# Patient Record
Sex: Male | Born: 1935 | Race: White | Hispanic: No | Smoking: Former smoker
Health system: Southern US, Community
[De-identification: ages and names within clinical notes are randomized; demographics above are authoritative.]

## PROBLEM LIST (undated history)

## (undated) DIAGNOSIS — I2510 Atherosclerotic heart disease of native coronary artery without angina pectoris: Secondary | ICD-10-CM

## (undated) DIAGNOSIS — I219 Acute myocardial infarction, unspecified: Secondary | ICD-10-CM

## (undated) DIAGNOSIS — K219 Gastro-esophageal reflux disease without esophagitis: Secondary | ICD-10-CM

## (undated) DIAGNOSIS — I82451 Acute embolism and thrombosis of right peroneal vein: Secondary | ICD-10-CM

## (undated) DIAGNOSIS — K4090 Unilateral inguinal hernia, without obstruction or gangrene, not specified as recurrent: Secondary | ICD-10-CM

## (undated) DIAGNOSIS — R7303 Prediabetes: Secondary | ICD-10-CM

## (undated) DIAGNOSIS — I480 Paroxysmal atrial fibrillation: Secondary | ICD-10-CM

## (undated) DIAGNOSIS — R0609 Other forms of dyspnea: Secondary | ICD-10-CM

## (undated) DIAGNOSIS — I10 Essential (primary) hypertension: Secondary | ICD-10-CM

## (undated) DIAGNOSIS — N400 Enlarged prostate without lower urinary tract symptoms: Secondary | ICD-10-CM

## (undated) DIAGNOSIS — R0600 Dyspnea, unspecified: Secondary | ICD-10-CM

## (undated) DIAGNOSIS — G2581 Restless legs syndrome: Secondary | ICD-10-CM

## (undated) DIAGNOSIS — G4733 Obstructive sleep apnea (adult) (pediatric): Secondary | ICD-10-CM

## (undated) DIAGNOSIS — E039 Hypothyroidism, unspecified: Secondary | ICD-10-CM

## (undated) DIAGNOSIS — I4510 Unspecified right bundle-branch block: Secondary | ICD-10-CM

## (undated) DIAGNOSIS — R600 Localized edema: Secondary | ICD-10-CM

## (undated) DIAGNOSIS — E785 Hyperlipidemia, unspecified: Secondary | ICD-10-CM

## (undated) DIAGNOSIS — C9110 Chronic lymphocytic leukemia of B-cell type not having achieved remission: Secondary | ICD-10-CM

## (undated) DIAGNOSIS — Z951 Presence of aortocoronary bypass graft: Secondary | ICD-10-CM

## (undated) DIAGNOSIS — G20A1 Parkinson's disease without dyskinesia, without mention of fluctuations: Secondary | ICD-10-CM

## (undated) DIAGNOSIS — I6789 Other cerebrovascular disease: Secondary | ICD-10-CM

## (undated) DIAGNOSIS — M4306 Spondylolysis, lumbar region: Secondary | ICD-10-CM

## (undated) DIAGNOSIS — M1990 Unspecified osteoarthritis, unspecified site: Secondary | ICD-10-CM

## (undated) DIAGNOSIS — R4189 Other symptoms and signs involving cognitive functions and awareness: Secondary | ICD-10-CM

## (undated) DIAGNOSIS — I4891 Unspecified atrial fibrillation: Secondary | ICD-10-CM

## (undated) DIAGNOSIS — R29898 Other symptoms and signs involving the musculoskeletal system: Secondary | ICD-10-CM

## (undated) DIAGNOSIS — E119 Type 2 diabetes mellitus without complications: Secondary | ICD-10-CM

## (undated) DIAGNOSIS — M48061 Spinal stenosis, lumbar region without neurogenic claudication: Secondary | ICD-10-CM

## (undated) DIAGNOSIS — I700 Atherosclerosis of aorta: Secondary | ICD-10-CM

## (undated) DIAGNOSIS — I5030 Unspecified diastolic (congestive) heart failure: Secondary | ICD-10-CM

## (undated) DIAGNOSIS — Z7901 Long term (current) use of anticoagulants: Secondary | ICD-10-CM

## (undated) HISTORY — DX: Acute myocardial infarction, unspecified: I21.9

## (undated) HISTORY — PX: CORONARY ARTERY BYPASS GRAFT: SHX141

## (undated) HISTORY — PX: TONSILLECTOMY: SUR1361

## (undated) HISTORY — DX: Atherosclerotic heart disease of native coronary artery without angina pectoris: I25.10

## (undated) HISTORY — PX: COLONOSCOPY W/ POLYPECTOMY: SHX1380

## (undated) HISTORY — PX: HERNIA REPAIR: SHX51

## (undated) HISTORY — PX: OTHER SURGICAL HISTORY: SHX169

---

## 1986-03-14 DIAGNOSIS — I219 Acute myocardial infarction, unspecified: Secondary | ICD-10-CM

## 1986-03-14 DIAGNOSIS — Z951 Presence of aortocoronary bypass graft: Secondary | ICD-10-CM

## 1986-03-14 HISTORY — DX: Acute myocardial infarction, unspecified: I21.9

## 1986-03-14 HISTORY — PX: OTHER SURGICAL HISTORY: SHX169

## 1986-03-14 HISTORY — DX: Presence of aortocoronary bypass graft: Z95.1

## 1986-10-13 DIAGNOSIS — I219 Acute myocardial infarction, unspecified: Secondary | ICD-10-CM

## 1986-10-13 HISTORY — PX: LEFT HEART CATH AND CORONARY ANGIOGRAPHY: CATH118249

## 1986-10-13 HISTORY — DX: Acute myocardial infarction, unspecified: I21.9

## 1987-02-12 HISTORY — PX: CORONARY ARTERY BYPASS GRAFT: SHX141

## 2005-03-14 HISTORY — PX: OTHER SURGICAL HISTORY: SHX169

## 2005-03-14 HISTORY — PX: CORONARY STENT PLACEMENT: SHX1402

## 2015-12-30 DIAGNOSIS — I10 Essential (primary) hypertension: Secondary | ICD-10-CM | POA: Insufficient documentation

## 2015-12-30 DIAGNOSIS — E7800 Pure hypercholesterolemia, unspecified: Secondary | ICD-10-CM | POA: Insufficient documentation

## 2015-12-30 DIAGNOSIS — I2510 Atherosclerotic heart disease of native coronary artery without angina pectoris: Secondary | ICD-10-CM | POA: Insufficient documentation

## 2015-12-30 DIAGNOSIS — E039 Hypothyroidism, unspecified: Secondary | ICD-10-CM | POA: Insufficient documentation

## 2015-12-30 DIAGNOSIS — Z9861 Coronary angioplasty status: Secondary | ICD-10-CM | POA: Insufficient documentation

## 2016-01-13 ENCOUNTER — Inpatient Hospital Stay: Payer: Medicare HMO | Attending: Internal Medicine | Admitting: Internal Medicine

## 2016-01-13 ENCOUNTER — Encounter (INDEPENDENT_AMBULATORY_CARE_PROVIDER_SITE_OTHER): Payer: Self-pay

## 2016-01-13 ENCOUNTER — Inpatient Hospital Stay: Payer: Medicare HMO

## 2016-01-13 DIAGNOSIS — Z87891 Personal history of nicotine dependence: Secondary | ICD-10-CM | POA: Diagnosis not present

## 2016-01-13 DIAGNOSIS — D72829 Elevated white blood cell count, unspecified: Secondary | ICD-10-CM | POA: Diagnosis not present

## 2016-01-13 DIAGNOSIS — D72828 Other elevated white blood cell count: Secondary | ICD-10-CM | POA: Diagnosis not present

## 2016-01-13 DIAGNOSIS — Z7982 Long term (current) use of aspirin: Secondary | ICD-10-CM | POA: Insufficient documentation

## 2016-01-13 DIAGNOSIS — I252 Old myocardial infarction: Secondary | ICD-10-CM | POA: Insufficient documentation

## 2016-01-13 DIAGNOSIS — Z7902 Long term (current) use of antithrombotics/antiplatelets: Secondary | ICD-10-CM | POA: Insufficient documentation

## 2016-01-13 DIAGNOSIS — I2510 Atherosclerotic heart disease of native coronary artery without angina pectoris: Secondary | ICD-10-CM | POA: Diagnosis not present

## 2016-01-13 DIAGNOSIS — Z856 Personal history of leukemia: Secondary | ICD-10-CM | POA: Insufficient documentation

## 2016-01-13 DIAGNOSIS — Z955 Presence of coronary angioplasty implant and graft: Secondary | ICD-10-CM | POA: Insufficient documentation

## 2016-01-13 LAB — CBC WITH DIFFERENTIAL/PLATELET
BASOS ABS: 0.1 10*3/uL (ref 0–0.1)
BASOS PCT: 0 %
Eosinophils Absolute: 0.3 10*3/uL (ref 0–0.7)
Eosinophils Relative: 2 %
HEMATOCRIT: 42.8 % (ref 40.0–52.0)
HEMOGLOBIN: 14 g/dL (ref 13.0–18.0)
LYMPHS PCT: 75 %
Lymphs Abs: 13.8 10*3/uL — ABNORMAL HIGH (ref 1.0–3.6)
MCH: 28.6 pg (ref 26.0–34.0)
MCHC: 32.8 g/dL (ref 32.0–36.0)
MCV: 87.1 fL (ref 80.0–100.0)
MONOS PCT: 4 %
Monocytes Absolute: 0.7 10*3/uL (ref 0.2–1.0)
NEUTROS ABS: 3.5 10*3/uL (ref 1.4–6.5)
NEUTROS PCT: 19 %
Platelets: 154 10*3/uL (ref 150–440)
RBC: 4.91 MIL/uL (ref 4.40–5.90)
RDW: 14.1 % (ref 11.5–14.5)
WBC: 18.4 10*3/uL — ABNORMAL HIGH (ref 3.8–10.6)

## 2016-01-13 LAB — PATHOLOGIST SMEAR REVIEW

## 2016-01-13 NOTE — Progress Notes (Signed)
Patient's labs have been followed for the past 8 years due to leukocytosis and PCP would like for him to have hematology evaluation.

## 2016-01-13 NOTE — Progress Notes (Signed)
[place]  [phone] [phone]  ID: Connor Mays OB: 1930-08-24  MR#: [id number]  [id number]  Patient Care Team: Glendon Axe, MD as PCP - General (Internal Medicine)  CHIEF COMPLAINT: New Evaluation (leukocytosis) CLL   HPI: 82 year old gentleman who reportedly has a known history of chronic lymphocytic leukemia diagnosed about 6-8 years ago and had been in follow-up with hematologist Dr. Casey Burkitt out of [place] He did not require any treatment but he had been under observation.  All history today's as reported by the patient I do not have any records to substantiate this.  His performance status is 0 and his comorbid conditions include coronary artery disease with a bypass surgery several years ago and more recently a stent placed He is on Plavix and is stable.    REVIEW OF SYSTEMS:   Review of Systems  Constitutional: Negative.   HENT: Negative.   Eyes: Negative.   Respiratory: Negative.   Cardiovascular: Negative.   Gastrointestinal: Negative.   Genitourinary: Negative.   Musculoskeletal: Negative.   Skin: Negative.   Neurological: Negative.   Endo/Heme/Allergies: Negative.   Psychiatric/Behavioral: Negative.   All other systems reviewed and are negative.   As per HPI. Otherwise, a complete review of systems is negative.  PAST MEDICAL HISTORY: Past Medical History:  Diagnosis Date  . CAD (coronary artery disease)   . Heart attack     PAST SURGICAL HISTORY: Past Surgical History:  Procedure Laterality Date  . CORONARY STENT PLACEMENT  2007  . heart bypass  1988  . TONSILLECTOMY      FAMILY HISTORY: No family history on file.  ADVANCED DIRECTIVES (Y/N):  N  HEALTH MAINTENANCE: Social History  Substance Use Topics  . Smoking status: Former Smoker    Types: Cigars  . Smokeless tobacco: Never Used  . Alcohol use Not on file     Colonoscopy:  PAP:  Bone density:  Lipid  panel:  Allergies  Allergen Reactions  . Morphine Other (See Comments)    Pre-syncope    Current Outpatient Prescriptions  Medication Sig Dispense Refill  . Ascorbic Acid (VITAMIN C) 1000 MG tablet Take by mouth.    Marland Kitchen aspirin (GOODSENSE ASPIRIN) 325 MG tablet Take by mouth.    . Cholecalciferol (VITAMIN D3) 1000 units CAPS Take by mouth.    . clopidogrel (PLAVIX) 75 MG tablet Take by mouth.    . folic acid (FOLVITE) A999333 MCG tablet Take by mouth.    . levothyroxine (SYNTHROID, LEVOTHROID) 50 MCG tablet Take by mouth.    . losartan (COZAAR) 100 MG tablet Take by mouth.    . metoprolol (LOPRESSOR) 50 MG tablet TK 1 T PO QD    . Multiple Vitamin (MULTIVITAMIN) capsule Take 1 capsule by mouth daily.    . simvastatin (ZOCOR) 40 MG tablet Take 40 mg by mouth daily.     No current facility-administered medications for this visit.     OBJECTIVE: Vitals:   01/13/16 1434  BP: (!) 150/84  Pulse: 76  Resp: 18  Temp: (!) 96.2 F (35.7 C)     Body mass index is 33.27 kg/m.   2.3 meters squared  ECOG FS:0 - Asymptomatic  Physical Exam  Constitutional: He is oriented to person, place, and time. No distress.  obese  HENT:  Head: Normocephalic and atraumatic.  Eyes: EOM are normal. Pupils are equal, round, and reactive to light.  Neck: Normal range of motion. Neck supple.  Cardiovascular: Normal rate  and normal heart sounds.   B/l leg edema symmetric upto the ankles  Pulmonary/Chest: Effort normal and breath sounds normal.  Abdominal: Soft. Bowel sounds are normal. He exhibits no distension and no mass.  Musculoskeletal: Normal range of motion. He exhibits edema. He exhibits no tenderness or deformity.  Lymphadenopathy:    He has no cervical adenopathy.  Neurological: He is alert and oriented to person, place, and time. No cranial nerve deficit.  Skin: Skin is warm. No rash noted. No erythema. No pallor.  Psychiatric: He has a normal mood and affect. His behavior is normal. Judgment  and thought content normal.  Nursing note and vitals reviewed.  LAB RESULTS:   His CBC most recently drawn at [place] by his family Physician shows a white blood cell count of 18    ASSESSMENT:  CLL by history. Likely stage 0.- Needing confirmation   He is currently asymptomatic with no B symptoms. No palpable splenomegaly no palpable lymphadenopathy.  however there is no differential count in order to determine whether this is predominantly lymphocytosis as expected with CLL.   Plan  call for records from [facility] hematologist Dr. Casey Burkitt substantiate a diagnosis of CLL.  We'll draw a CBC and a peripheral blood flow cytometry.  After reviewing records if everything appears to be stable he can return for 1 year follow-up as he had been doing in [place] for the last 6-8 years.   Patient expressed understanding and was in agreement with this plan. He also understands that He can call clinic at any time with any questions, concerns, or complaints.  Orders Placed This Encounter  Procedures  . CBC with Differential    Standing Status:   Future    Standing Expiration Date:   01/12/2017    No Follow-up on file. No matching staging information was found for the patient.  Creola Corn, MD   01/13/2016 2:50 PM

## 2016-01-15 LAB — COMP PANEL: LEUKEMIA/LYMPHOMA

## 2016-04-13 DIAGNOSIS — R7303 Prediabetes: Secondary | ICD-10-CM | POA: Insufficient documentation

## 2016-10-07 ENCOUNTER — Encounter: Payer: Self-pay | Admitting: Hematology and Oncology

## 2016-10-16 DIAGNOSIS — N529 Male erectile dysfunction, unspecified: Secondary | ICD-10-CM | POA: Insufficient documentation

## 2016-10-16 DIAGNOSIS — M545 Low back pain, unspecified: Secondary | ICD-10-CM | POA: Insufficient documentation

## 2016-10-16 DIAGNOSIS — G8929 Other chronic pain: Secondary | ICD-10-CM | POA: Insufficient documentation

## 2016-12-14 DIAGNOSIS — G4733 Obstructive sleep apnea (adult) (pediatric): Secondary | ICD-10-CM | POA: Insufficient documentation

## 2016-12-14 DIAGNOSIS — G2581 Restless legs syndrome: Secondary | ICD-10-CM | POA: Insufficient documentation

## 2016-12-14 DIAGNOSIS — Z9989 Dependence on other enabling machines and devices: Secondary | ICD-10-CM | POA: Insufficient documentation

## 2017-01-12 ENCOUNTER — Other Ambulatory Visit: Payer: Self-pay | Admitting: *Deleted

## 2017-01-12 DIAGNOSIS — D72829 Elevated white blood cell count, unspecified: Secondary | ICD-10-CM

## 2017-01-13 ENCOUNTER — Encounter: Payer: Self-pay | Admitting: Hematology and Oncology

## 2017-01-13 ENCOUNTER — Inpatient Hospital Stay: Payer: Medicare HMO | Attending: Hematology and Oncology

## 2017-01-13 ENCOUNTER — Inpatient Hospital Stay (HOSPITAL_BASED_OUTPATIENT_CLINIC_OR_DEPARTMENT_OTHER): Payer: Medicare HMO | Admitting: Hematology and Oncology

## 2017-01-13 ENCOUNTER — Other Ambulatory Visit: Payer: Self-pay | Admitting: *Deleted

## 2017-01-13 VITALS — BP 127/73 | HR 60 | Temp 96.8°F | Resp 18 | Ht 70.0 in | Wt 235.2 lb

## 2017-01-13 DIAGNOSIS — M7989 Other specified soft tissue disorders: Secondary | ICD-10-CM | POA: Insufficient documentation

## 2017-01-13 DIAGNOSIS — C9110 Chronic lymphocytic leukemia of B-cell type not having achieved remission: Secondary | ICD-10-CM | POA: Diagnosis not present

## 2017-01-13 DIAGNOSIS — Z95818 Presence of other cardiac implants and grafts: Secondary | ICD-10-CM | POA: Insufficient documentation

## 2017-01-13 DIAGNOSIS — Z87891 Personal history of nicotine dependence: Secondary | ICD-10-CM

## 2017-01-13 DIAGNOSIS — I252 Old myocardial infarction: Secondary | ICD-10-CM | POA: Diagnosis not present

## 2017-01-13 DIAGNOSIS — Z79899 Other long term (current) drug therapy: Secondary | ICD-10-CM

## 2017-01-13 DIAGNOSIS — I2510 Atherosclerotic heart disease of native coronary artery without angina pectoris: Secondary | ICD-10-CM

## 2017-01-13 DIAGNOSIS — Z7982 Long term (current) use of aspirin: Secondary | ICD-10-CM

## 2017-01-13 DIAGNOSIS — D72829 Elevated white blood cell count, unspecified: Secondary | ICD-10-CM

## 2017-01-13 LAB — CBC WITH DIFFERENTIAL/PLATELET
Basophils Absolute: 0 10*3/uL (ref 0–0.1)
Basophils Relative: 0 %
Eosinophils Absolute: 0.2 10*3/uL (ref 0–0.7)
Eosinophils Relative: 1 %
HCT: 43.6 % (ref 40.0–52.0)
Hemoglobin: 14.4 g/dL (ref 13.0–18.0)
Lymphocytes Relative: 69 %
Lymphs Abs: 11.6 10*3/uL — ABNORMAL HIGH (ref 1.0–3.6)
MCH: 29.3 pg (ref 26.0–34.0)
MCHC: 33 g/dL (ref 32.0–36.0)
MCV: 89 fL (ref 80.0–100.0)
Monocytes Absolute: 0.6 10*3/uL (ref 0.2–1.0)
Monocytes Relative: 3 %
Neutro Abs: 4.6 10*3/uL (ref 1.4–6.5)
Neutrophils Relative %: 27 %
Platelets: 151 10*3/uL (ref 150–440)
RBC: 4.9 MIL/uL (ref 4.40–5.90)
RDW: 14 % (ref 11.5–14.5)
WBC: 17 10*3/uL — ABNORMAL HIGH (ref 3.8–10.6)

## 2017-01-13 NOTE — Progress Notes (Signed)
[facility] day:  01/13/2017  Chief Complaint: Connor Mays is a 83 y.o. male  with chronic lymphocytic leukemia (CLL) who is seen for reassessment.  HPI: The patient was diagnosed with CLL approximately 9 years ago. He was followed by Dr. Adriana Simas in [place]. He has not required any treatment and has been under observation. Patient moved to [facility] in September of 2017. Patient has never had bone marrow biopsy. Highest WBC that the patient can recall was 22,000.   The patient was last seen in the medical oncology clinic on 01/13/2016 by Creola Corn.  At that time, he denied any symptoms. Exam revealed no adenopathy.    CBC on 01/13/2016 revealed a hematocrit of 42.8, hemoglobin 14.0, MCV 87.1, platelets 154,000, white count 18,400 with an ANC of 3500.  Absolute lymphocyte count was 13,800.  Peripheral smear revealed significant lymphocytosis with a monomorphic population of small lymphocytes consistent with CLL.  Flow cytometry on 01/12/2017 revealed a CD5 positive, CD23 positive clonal B-cell population with dim lambda light chain restriction, representing greater than 99% of the B cells and 70% of the leukocytes. Phenotype was typical of CLL/SLL. CD38 was expressed on a proximally 76% of clonal B cells.   There were no circulating blasts.  Symptimatically, patient feels well. He denies B symptoms and infections. There is no pruritis associated with bathing. Patient denies bruising or bleeding; no hematochezia or melena. Last colonoscopy was 10 years ago. He denies familial history of cancers. He has prostate checked on a regular basis. Patient has intermittent ankle swelling.    Past Medical History:  Diagnosis Date  . CAD (coronary artery disease)   . Heart attack [facility])     Past Surgical History:  Procedure Laterality Date  . CORONARY STENT PLACEMENT  2007  . heart bypass  1988  . TONSILLECTOMY      Family History   Problem Relation Age of Onset  . Healthy Sister   . Bladder Cancer Brother   . Healthy Sister     Social History:  reports that he has quit smoking. His smoking use included Cigars. He has never used smokeless tobacco. He reports that he does not use drugs. His alcohol history is not on file.  Patient is a former pipe and cigar smoker; quit 50 years ago. Patient infrequently drinks beer. Patient denies known exposure to radiation and toxins. Patient is a retired Conservation officer, nature; worked for [employer]. The patient is alone today.  Allergies:  Allergies  Allergen Reactions  . Morphine Other (See Comments)    Pre-syncope    Current Medications: Current Outpatient Prescriptions  Medication Sig Dispense Refill  . Ascorbic Acid (VITAMIN C) 1000 MG tablet Take by mouth.    Marland Kitchen aspirin EC 81 MG tablet Take 81 mg by mouth daily.    . Cholecalciferol (VITAMIN D3) 1000 units CAPS Take by mouth.    . clopidogrel (PLAVIX) 75 MG tablet Take by mouth.    . folic acid (FOLVITE) 157 MCG tablet Take by mouth.    . levothyroxine (SYNTHROID, LEVOTHROID) 50 MCG tablet Take by mouth.    . losartan (COZAAR) 100 MG tablet Take by mouth.    . metoprolol (LOPRESSOR) 50 MG tablet TK 1 T PO QD    . Multiple Vitamin (MULTIVITAMIN) capsule Take 1 capsule by mouth daily.    . simvastatin (ZOCOR) 40 MG tablet Take 40 mg by mouth daily.     No current  facility-administered medications for this visit.     Review of Systems:  GENERAL:  Feels well.  No fevers, sweats or weight loss. PERFORMANCE STATUS (ECOG):  1 HEENT:  No visual changes, runny nose, sore throat, mouth sores or tenderness. Lungs: No shortness of breath or cough.  No hemoptysis. Cardiac:  No chest pain, palpitations, orthopnea, or PND. GI:  "loves to eat".  No early satiety.  No nausea, vomiting, diarrhea, constipation, melena or hematochezia. Last colonoscopy 10 years ago. GU:  No urgency, frequency, dysuria, or hematuria.  PSA "no  problems". Musculoskeletal:  No back pain.  No joint pain.  No muscle tenderness. Extremities:  Intermittent ankle swelling. No pain. Skin:  No rashes or skin changes. Neuro:  No headache, numbness or weakness, balance or coordination issues. Endocrine:  No diabetes.  Thyroid disease on Synthroid.  No hot flashes or night sweats. Psych:  No mood changes, depression or anxiety. Pain:  No focal pain. Review of systems:  All other systems reviewed and found to be negative.  Physical Exam: Blood pressure 127/73, pulse 60, temperature (!) 96.8 F (36 C), temperature source Tympanic, resp. rate 18, height _0  (1.778 m), weight 235 lb 3.2 oz (106.7 kg). GENERAL:  Well developed, well nourished, gentleman sitting comfortably in the exam room in no acute distress.  He appears younger than his stated age. MENTAL STATUS:  Alert and oriented to person, place and time. HEAD:  Pearline Cables hair.  Normocephalic, atraumatic, face symmetric, no Cushingoid features. EYES:  Glasses.  Blue eyes.  Pupils equal round and reactive to light and accomodation.  No conjunctivitis or scleral icterus. ENT:  Oropharynx clear without lesion.  Tongue normal. Mucous membranes moist.  RESPIRATORY:  Clear to auscultation without rales, wheezes or rhonchi. CARDIOVASCULAR:  Regular rate and rhythm without murmur, rub or gallop. ABDOMEN:  Soft, non-tender, with active bowel sounds, and no hepatosplenomegaly.  No masses. SKIN:  No rashes, ulcers or lesions. EXTREMITIES: No edema, no skin discoloration or tenderness.  No palpable cords. LYMPH NODES: No palpable cervical, supraclavicular, axillary or inguinal adenopathy  NEUROLOGICAL: Unremarkable. PSYCH:  Appropriate.   Appointment on 01/13/2017  Component Date Value Ref Range Status  . WBC 01/13/2017 17.0* 3.8 - 10.6 K/uL Final  . RBC 01/13/2017 4.90  4.40 - 5.90 MIL/uL Final  . Hemoglobin 01/13/2017 14.4  13.0 - 18.0 g/dL Final  . HCT 01/13/2017 43.6  40.0 - 52.0 % Final  .  MCV 01/13/2017 89.0  80.0 - 100.0 fL Final  . MCH 01/13/2017 29.3  26.0 - 34.0 pg Final  . MCHC 01/13/2017 33.0  32.0 - 36.0 g/dL Final  . RDW 01/13/2017 14.0  11.5 - 14.5 % Final  . Platelets 01/13/2017 151  150 - 440 K/uL Final  . Neutrophils Relative % 01/13/2017 27  % Final  . Neutro Abs 01/13/2017 4.6  1.4 - 6.5 K/uL Final  . Lymphocytes Relative 01/13/2017 69  % Final  . Lymphs Abs 01/13/2017 11.6* 1.0 - 3.6 K/uL Final  . Monocytes Relative 01/13/2017 3  % Final  . Monocytes Absolute 01/13/2017 0.6  0.2 - 1.0 K/uL Final  . Eosinophils Relative 01/13/2017 1  % Final  . Eosinophils Absolute 01/13/2017 0.2  0 - 0.7 K/uL Final  . Basophils Relative 01/13/2017 0  % Final  . Basophils Absolute 01/13/2017 0.0  0 - 0.1 K/uL Final    Assessment:  Connor Mays is a 83 y.o. male with stage 0 CLL.  He was diagnosed in 2010-2012  while living in [place], [place].  WBC has ranged between 17,000 - 22,000.  Flow cytometry on 01/12/2017 revealed a CD5 positive, CD23 positive clonal B-cell population with dim lambda light chain restriction, representing greater than 99% of the B cells and 70% of the leukocytes. Phenotype was typical of CLL/SLL. CD38 was expressed on a proximally 76% of clonal B cells.   There were no circulating blasts.  Symptimatically, he feels well.  He denies any B symptoms, bruising or bleeding, or issues with infections. Exam reveals no adenopathy or hepatosplenomegaly.  Plan: 1.  Labs today: CBC with diff 2.  Discuss medical history and stage 0 CLL. WBC stable at 17,000. Discuss annual monitoring in the hematology clinic.  Patient to contact clinic if any interval concerns. 3.  RTC in 1 year for MD assessment and labs (CBC with diff).   Honor Loh, NP  01/13/2017, 10:53 AM   I saw and evaluated the patient, participating in the key portions of the service and reviewing pertinent diagnostic studies and records.  I reviewed the nurse practitioner's note and agree with the  findings and the plan.  The assessment and plan were discussed with the patient.  A few questions were asked by the patient and answered.   Nolon Stalls, MD 01/13/2017, 10:53 AM

## 2017-01-14 ENCOUNTER — Encounter: Payer: Self-pay | Admitting: Hematology and Oncology

## 2017-05-02 ENCOUNTER — Ambulatory Visit
Admission: RE | Admit: 2017-05-02 | Discharge: 2017-05-02 | Disposition: A | Discharge status: Home / Self Care | Payer: Medicare HMO | Source: Ambulatory Visit | Attending: Internal Medicine | Admitting: Internal Medicine

## 2017-05-02 ENCOUNTER — Encounter
Admission: RE | Disposition: A | Discharge status: Home / Self Care | Payer: Self-pay | Source: Ambulatory Visit | Attending: Internal Medicine

## 2017-05-02 ENCOUNTER — Encounter: Payer: Self-pay | Admitting: Emergency Medicine

## 2017-05-02 DIAGNOSIS — I2510 Atherosclerotic heart disease of native coronary artery without angina pectoris: Secondary | ICD-10-CM | POA: Insufficient documentation

## 2017-05-02 DIAGNOSIS — R0602 Shortness of breath: Secondary | ICD-10-CM | POA: Insufficient documentation

## 2017-05-02 DIAGNOSIS — Z951 Presence of aortocoronary bypass graft: Secondary | ICD-10-CM | POA: Insufficient documentation

## 2017-05-02 DIAGNOSIS — I2582 Chronic total occlusion of coronary artery: Secondary | ICD-10-CM | POA: Insufficient documentation

## 2017-05-02 HISTORY — PX: LEFT HEART CATH AND CORS/GRAFTS ANGIOGRAPHY: CATH118250

## 2017-05-02 LAB — CARDIAC CATHETERIZATION: CATHEFQUANT: 50 %

## 2017-05-02 SURGERY — LEFT HEART CATH AND CORONARY ANGIOGRAPHY
Anesthesia: Moderate Sedation

## 2017-05-02 SURGERY — LEFT HEART CATH AND CORS/GRAFTS ANGIOGRAPHY
Anesthesia: Moderate Sedation

## 2017-05-02 MED ORDER — SODIUM CHLORIDE 0.9 % WEIGHT BASED INFUSION
3.0000 mL/kg/h | INTRAVENOUS | Status: AC
  Administered 2017-05-02: 3 mL/kg/h via INTRAVENOUS

## 2017-05-02 MED ORDER — ACETAMINOPHEN 325 MG PO TABS: 650.0000 mg | ORAL_TABLET | ORAL | Status: DC | PRN

## 2017-05-02 MED ORDER — FENTANYL CITRATE (PF) 100 MCG/2ML IJ SOLN
INTRAMUSCULAR | Status: AC
  Filled 2017-05-02: qty 2

## 2017-05-02 MED ORDER — IOPAMIDOL (ISOVUE-300) INJECTION 61%
INTRAVENOUS | Status: DC | PRN
  Administered 2017-05-02: 160 mL via INTRA_ARTERIAL

## 2017-05-02 MED ORDER — ONDANSETRON HCL 4 MG/2ML IJ SOLN: 4.0000 mg | Freq: Four times a day (QID) | INTRAMUSCULAR | Status: DC | PRN

## 2017-05-02 MED ORDER — SODIUM CHLORIDE 0.9 % WEIGHT BASED INFUSION: 1.0000 mL/kg/h | INTRAVENOUS | Status: DC

## 2017-05-02 MED ORDER — SODIUM CHLORIDE 0.9% FLUSH: 3.0000 mL | Freq: Two times a day (BID) | INTRAVENOUS | Status: DC

## 2017-05-02 MED ORDER — FENTANYL CITRATE (PF) 100 MCG/2ML IJ SOLN
INTRAMUSCULAR | Status: DC | PRN
  Administered 2017-05-02: 25 ug via INTRAVENOUS

## 2017-05-02 MED ORDER — SODIUM CHLORIDE 0.9 % IV SOLN: 250.0000 mL | INTRAVENOUS | Status: DC | PRN

## 2017-05-02 MED ORDER — SODIUM CHLORIDE 0.9% FLUSH: 3.0000 mL | INTRAVENOUS | Status: DC | PRN

## 2017-05-02 MED ORDER — MIDAZOLAM HCL 2 MG/2ML IJ SOLN
INTRAMUSCULAR | Status: DC | PRN
  Administered 2017-05-02: 1 mg via INTRAVENOUS

## 2017-05-02 MED ORDER — HEPARIN (PORCINE) IN NACL 2-0.9 UNIT/ML-% IJ SOLN
INTRAMUSCULAR | Status: AC
  Filled 2017-05-02: qty 1000

## 2017-05-02 MED ORDER — MIDAZOLAM HCL 2 MG/2ML IJ SOLN
INTRAMUSCULAR | Status: AC
  Filled 2017-05-02: qty 2

## 2017-05-02 MED ORDER — ASPIRIN 81 MG PO CHEW: 81.0000 mg | CHEWABLE_TABLET | ORAL | Status: DC

## 2017-05-02 MED ORDER — ROPINIROLE HCL 1 MG PO TABS
1.0000 mg | ORAL_TABLET | Freq: Once | ORAL | Status: AC
  Administered 2017-05-02: 1 mg via ORAL
  Filled 2017-05-02: qty 1

## 2017-05-02 SURGICAL SUPPLY — 11 items
CATH 5FR PIGTAIL DIAGNOSTIC (CATHETERS) ×2 IMPLANT
CATH INFINITI 5 FR IM (CATHETERS) ×2 IMPLANT
CATH INFINITI 5FR JL4 (CATHETERS) ×2 IMPLANT
CATH INFINITI JR4 5F (CATHETERS) ×2 IMPLANT
DEVICE CLOSURE MYNXGRIP 5F (Vascular Products) ×2 IMPLANT
KIT MANI 3VAL PERCEP (MISCELLANEOUS) ×2 IMPLANT
NEEDLE PERC 18GX7CM (NEEDLE) ×2 IMPLANT
PACK CARDIAC CATH (CUSTOM PROCEDURE TRAY) ×2 IMPLANT
SHEATH AVANTI 5FR X 11CM (SHEATH) ×2 IMPLANT
WIRE EMERALD 3MM-J .035X260CM (WIRE) ×2 IMPLANT
WIRE GUIDERIGHT .035X150 (WIRE) ×2 IMPLANT

## 2017-05-02 NOTE — Discharge Instructions (Signed)
Femoral Site Care °Refer to this sheet in the next few weeks. These instructions provide you with information about caring for yourself after your procedure. Your health care provider may also give you more specific instructions. Your treatment has been planned according to current medical practices, but problems sometimes occur. Call your health care provider if you have any problems or questions after your procedure. °What can I expect after the procedure? °After your procedure, it is typical to have the following: °· Bruising at the site that usually fades within 1-2 weeks. °· Blood collecting in the tissue (hematoma) that may be painful to the touch. It should usually decrease in size and tenderness within 1-2 weeks. ° °Follow these instructions at home: °· Take medicines only as directed by your health care provider. °· You may shower 24-48 hours after the procedure or as directed by your health care provider. Remove the bandage (dressing) and gently wash the site with plain soap and water. Pat the area dry with a clean towel. Do not rub the site, because this may cause bleeding. °· Do not take baths, swim, or use a hot tub until your health care provider approves. °· Check your insertion site every day for redness, swelling, or drainage. °· Do not apply powder or lotion to the site. °· Limit use of stairs to twice a day for the first 2-3 days or as directed by your health care provider. °· Do not squat for the first 2-3 days or as directed by your health care provider. °· Do not lift over 10 lb (4.5 kg) for 5 days after your procedure or as directed by your health care provider. °· Ask your health care provider when it is okay to: °? Return to work or school. °? Resume usual physical activities or sports. °? Resume sexual activity. °· Do not drive home if you are discharged the same day as the procedure. Have someone else drive you. °· You may drive 24 hours after the procedure unless otherwise instructed by  your health care provider. °· Do not operate machinery or power tools for 24 hours after the procedure or as directed by your health care provider. °· If your procedure was done as an outpatient procedure, which means that you went home the same day as your procedure, a responsible adult should be with you for the first 24 hours after you arrive home. °· Keep all follow-up visits as directed by your health care provider. This is important. °Contact a health care provider if: °· You have a fever. °· You have chills. °· You have increased bleeding from the site. Hold pressure on the site. °Get help right away if: °· You have unusual pain at the site. °· You have redness, warmth, or swelling at the site. °· You have drainage (other than a small amount of blood on the dressing) from the site. °· The site is bleeding, and the bleeding does not stop after 30 minutes of holding steady pressure on the site. °· Your leg or foot becomes pale, cool, tingly, or numb. °This information is not intended to replace advice given to you by your health care provider. Make sure you discuss any questions you have with your health care provider. °Document Released: 11/01/2013 Document Revised: 08/06/2015 Document Reviewed: 09/17/2013 °Elsevier Interactive Patient Education © 2018 [employer] °Moderate Conscious Sedation, Adult, Care After °These instructions provide you with information about caring for yourself after your procedure. Your health care provider may also give you more   specific instructions. Your treatment has been planned according to current medical practices, but problems sometimes occur. Call your health care provider if you have any problems or questions after your procedure. °What can I expect after the procedure? °After your procedure, it is common: °· To feel sleepy for several hours. °· To feel clumsy and have poor balance for several hours. °· To have poor judgment for several hours. °· To vomit if you eat too  soon. ° °Follow these instructions at home: °For at least 24 hours after the procedure: ° °· Do not: °? Participate in activities where you could fall or become injured. °? Drive. °? Use heavy machinery. °? Drink alcohol. °? Take sleeping pills or medicines that cause drowsiness. °? Make important decisions or sign legal documents. °? Take care of children on your own. °· Rest. °Eating and drinking °· Follow the diet recommended by your health care provider. °· If you vomit: °? Drink water, juice, or soup when you can drink without vomiting. °? Make sure you have little or no nausea before eating solid foods. °General instructions °· Have a responsible adult stay with you until you are awake and alert. °· Take over-the-counter and prescription medicines only as told by your health care provider. °· If you smoke, do not smoke without supervision. °· Keep all follow-up visits as told by your health care provider. This is important. °Contact a health care provider if: °· You keep feeling nauseous or you keep vomiting. °· You feel light-headed. °· You develop a rash. °· You have a fever. °Get help right away if: °· You have trouble breathing. °This information is not intended to replace advice given to you by your health care provider. Make sure you discuss any questions you have with your health care provider. °Document Released: 12/19/2012 Document Revised: 08/03/2015 Document Reviewed: 06/20/2015 °Elsevier Interactive Patient Education © 2018 [employer] ° °

## 2017-05-03 ENCOUNTER — Encounter: Payer: Self-pay | Admitting: Internal Medicine

## 2017-05-09 ENCOUNTER — Other Ambulatory Visit: Payer: Self-pay | Admitting: Specialist

## 2017-05-09 DIAGNOSIS — R0602 Shortness of breath: Secondary | ICD-10-CM

## 2017-05-09 DIAGNOSIS — R0902 Hypoxemia: Secondary | ICD-10-CM

## 2017-05-19 ENCOUNTER — Ambulatory Visit
Admission: RE | Admit: 2017-05-19 | Discharge: 2017-05-19 | Disposition: A | Discharge status: Home / Self Care | Payer: Medicare HMO | Source: Ambulatory Visit | Attending: Specialist | Admitting: Specialist

## 2017-05-19 DIAGNOSIS — I313 Pericardial effusion (noninflammatory): Secondary | ICD-10-CM | POA: Insufficient documentation

## 2017-05-19 DIAGNOSIS — R0902 Hypoxemia: Secondary | ICD-10-CM

## 2017-05-19 DIAGNOSIS — I700 Atherosclerosis of aorta: Secondary | ICD-10-CM | POA: Insufficient documentation

## 2017-05-19 DIAGNOSIS — R918 Other nonspecific abnormal finding of lung field: Secondary | ICD-10-CM | POA: Insufficient documentation

## 2017-05-19 DIAGNOSIS — R0602 Shortness of breath: Secondary | ICD-10-CM

## 2018-01-15 ENCOUNTER — Inpatient Hospital Stay (HOSPITAL_BASED_OUTPATIENT_CLINIC_OR_DEPARTMENT_OTHER): Payer: Medicare HMO | Admitting: Hematology and Oncology

## 2018-01-15 ENCOUNTER — Other Ambulatory Visit: Payer: Self-pay

## 2018-01-15 ENCOUNTER — Encounter: Payer: Self-pay | Admitting: Hematology and Oncology

## 2018-01-15 ENCOUNTER — Inpatient Hospital Stay: Payer: Medicare HMO | Attending: Hematology and Oncology

## 2018-01-15 VITALS — BP 169/62 | HR 73 | Temp 97.9°F | Resp 18 | Ht 70.0 in | Wt 234.5 lb

## 2018-01-15 DIAGNOSIS — D72829 Elevated white blood cell count, unspecified: Secondary | ICD-10-CM

## 2018-01-15 DIAGNOSIS — C9110 Chronic lymphocytic leukemia of B-cell type not having achieved remission: Secondary | ICD-10-CM | POA: Diagnosis not present

## 2018-01-15 DIAGNOSIS — Z79899 Other long term (current) drug therapy: Secondary | ICD-10-CM | POA: Diagnosis not present

## 2018-01-15 DIAGNOSIS — Z7982 Long term (current) use of aspirin: Secondary | ICD-10-CM

## 2018-01-15 DIAGNOSIS — Z87891 Personal history of nicotine dependence: Secondary | ICD-10-CM

## 2018-01-15 LAB — CBC WITH DIFFERENTIAL/PLATELET
Abs Immature Granulocytes: 0.02 10*3/uL (ref 0.00–0.07)
Basophils Absolute: 0.1 10*3/uL (ref 0.0–0.1)
Basophils Relative: 0 %
Eosinophils Absolute: 0.1 10*3/uL (ref 0.0–0.5)
Eosinophils Relative: 1 %
HCT: 42.6 % (ref 39.0–52.0)
Hemoglobin: 13.8 g/dL (ref 13.0–17.0)
Immature Granulocytes: 0 %
Lymphocytes Relative: 68 %
Lymphs Abs: 9.5 10*3/uL — ABNORMAL HIGH (ref 0.7–4.0)
MCH: 28.9 pg (ref 26.0–34.0)
MCHC: 32.4 g/dL (ref 30.0–36.0)
MCV: 89.3 fL (ref 80.0–100.0)
Monocytes Absolute: 0.8 10*3/uL (ref 0.1–1.0)
Monocytes Relative: 6 %
Neutro Abs: 3.4 10*3/uL (ref 1.7–7.7)
Neutrophils Relative %: 25 %
Platelets: 157 10*3/uL (ref 150–400)
RBC: 4.77 MIL/uL (ref 4.22–5.81)
RDW: 13.6 % (ref 11.5–15.5)
WBC: 13.9 10*3/uL — ABNORMAL HIGH (ref 4.0–10.5)
nRBC: 0.1 % (ref 0.0–0.2)

## 2018-01-15 NOTE — Progress Notes (Signed)
[facility] day:  01/15/2018  Chief Complaint: Connor Mays is a 84 y.o. male  with chronic lymphocytic leukemia (CLL) who is seen for 1 year assessment.  HPI: The patient was last seen in the medical oncology clinic on 01/13/2017 for initial aasessment.  He was diagnosed in 2010-2012 while living in [place], [place].  WBC ranged between 17,000 - 22,000.  Symptomatically, he denied any B symptoms.  Exam revealed no adenopathy or hepatosplenomegaly.  During the interim, patient has been doing well. He denies any acute concerns today. Patient states, "I am getting better. My white count has been down as low as 15,000". Patient denies that he has experienced any B symptoms. He denies any interval infections. He has not appreciated any new areas of palpable adenopathy.   Patient has continued ankle edema. He takes PRN furosemide and oral KCl.  Patient advises that he maintains an adequate appetite. He is eating well, and denies early satiety.  Weight today is 234 lb 8 oz (106.4 kg), which compared to his last visit to the clinic, represents a 1 pound decrease.   Patient complains of lower back pain rated 2/10 in the clinic today.   Past Medical History:  Diagnosis Date  . CAD (coronary artery disease)   . Heart attack [facility])     Past Surgical History:  Procedure Laterality Date  . CORONARY STENT PLACEMENT  2007  . heart bypass  1988  . LEFT HEART CATH AND CORS/GRAFTS ANGIOGRAPHY N/A 05/02/2017   Procedure: LEFT HEART CATH AND CORS/GRAFTS ANGIOGRAPHY;  Surgeon: Yolonda Kida, MD;  Location: [facility];  Service: Cardiovascular;  Laterality: N/A;  . TONSILLECTOMY      Family History  Problem Relation Age of Onset  . Healthy Sister   . Bladder Cancer Brother   . Healthy Sister     Social History:  reports that he has quit smoking. His smoking use included cigars. He has never used smokeless tobacco. He reports that he does  not drink alcohol or use drugs.  Patient is a former pipe and cigar smoker; quit 50 years ago. Patient infrequently drinks beer. Patient denies known exposure to radiation and toxins. Patient is a retired Conservation officer, nature; worked for [employer]. The patient is alone today.  Allergies:  Allergies  Allergen Reactions  . Morphine Other (See Comments)    Pre-syncope    Current Medications: Current Outpatient Medications  Medication Sig Dispense Refill  . aspirin EC 81 MG tablet Take 81 mg by mouth daily.    . Cholecalciferol (VITAMIN D3) 1000 units CAPS Take 1,000 Units by mouth daily.     . clopidogrel (PLAVIX) 75 MG tablet Take 75 mg by mouth daily.     . folic acid (FOLVITE) 102 MCG tablet Take 800 mcg by mouth daily.     Marland Kitchen levothyroxine (SYNTHROID, LEVOTHROID) 50 MCG tablet Take 50 mcg by mouth daily before breakfast.     . losartan (COZAAR) 100 MG tablet Take 100 mg by mouth daily.     . metoprolol succinate (TOPROL-XL) 50 MG 24 hr tablet Take 50 mg by mouth daily. Take with or immediately following a meal.    . Multiple Vitamin (MULTIVITAMIN) capsule Take 1 capsule by mouth daily.    Marland Kitchen rOPINIRole (REQUIP) 0.5 MG tablet Take 1 mg by mouth at bedtime.  3  . simvastatin (ZOCOR) 40 MG tablet Take 40 mg by mouth daily.    Marland Kitchen  vitamin C (ASCORBIC ACID) 500 MG tablet Take 500 mg by mouth daily.     . cetirizine (ZYRTEC) 10 MG tablet Take 10 mg by mouth daily as needed for allergies.    Marland Kitchen ibuprofen (ADVIL,MOTRIN) 200 MG tablet Take 400 mg by mouth every 6 (six) hours as needed for headache or moderate pain.    . naproxen sodium (ALEVE) 220 MG tablet Take 440 mg by mouth daily as needed (for pain or headache).     No current facility-administered medications for this visit.     Review of Systems  Constitutional: Positive for weight loss (down 1 pound). Negative for diaphoresis, fever and malaise/fatigue.  HENT: Negative.   Eyes: Negative.   Respiratory: Negative for cough, hemoptysis, sputum  production and shortness of breath.   Cardiovascular: Negative for chest pain, palpitations, orthopnea, leg swelling (ankles) and PND.  Gastrointestinal: Negative for abdominal pain, blood in stool, constipation, diarrhea, melena, nausea and vomiting.  Genitourinary: Negative for dysuria, frequency, hematuria and urgency.  Musculoskeletal: Positive for back pain (lower back ache; pain 2 out of 10). Negative for falls, joint pain and myalgias.  Skin: Negative for itching and rash.  Neurological: Negative for dizziness, tremors, weakness and headaches.  Endo/Heme/Allergies: Does not bruise/bleed easily.       PMH (+) for HYPOthyroidism - on levothyroxine  Psychiatric/Behavioral: Negative for depression, memory loss and suicidal ideas. The patient is not nervous/anxious and does not have insomnia.   All other systems reviewed and are negative.  Performance status (ECOG): 1 - Symptomatic but completely ambulatory  Vital Signs BP (!) 169/62 (BP Location: Left Arm, Patient Position: Sitting)   Pulse 73   Temp 97.9 F (36.6 C)   Resp 18   Ht 5\' 10"  (1.778 m)   Wt 234 lb 8 oz (106.4 kg)   SpO2 94%   BMI 33.65 kg/m   Physical Exam  Constitutional: He is oriented to person, place, and time and well-developed, well-nourished, and in no distress. No distress.  HENT:  Head: Normocephalic and atraumatic.  Mouth/Throat: Oropharynx is clear and moist and mucous membranes are normal. No oropharyngeal exudate.  Gray hair.  Eyes: Pupils are equal, round, and reactive to light. Conjunctivae and EOM are normal. No scleral icterus.  Glasses.  Blue.  Neck: Normal range of motion. Neck supple. No JVD present.  Cardiovascular: Normal rate, regular rhythm, normal heart sounds and intact distal pulses. Exam reveals no gallop and no friction rub.  No murmur heard. Pulmonary/Chest: Effort normal and breath sounds normal. No respiratory distress. He has no wheezes. He has no rales.  Abdominal: Soft. Bowel  sounds are normal. He exhibits no distension and no mass. There is no abdominal tenderness. There is no rebound and no guarding.  Musculoskeletal: Normal range of motion. He exhibits no edema or tenderness.  Lymphadenopathy:    He has no cervical adenopathy.    He has no axillary adenopathy.       Right: No inguinal and no supraclavicular adenopathy present.       Left: No inguinal and no supraclavicular adenopathy present.  Neurological: He is alert and oriented to person, place, and time.  Skin: Skin is warm and dry. No rash noted. He is not diaphoretic. No erythema.  Psychiatric: Mood, affect and judgment normal.  Nursing note and vitals reviewed.   Orders Only on 01/15/2018  Component Date Value Ref Range Status  . WBC 01/15/2018 13.9* 4.0 - 10.5 K/uL Final  . RBC 01/15/2018 4.77  4.22 -  5.81 MIL/uL Final  . Hemoglobin 01/15/2018 13.8  13.0 - 17.0 g/dL Final  . HCT 01/15/2018 42.6  39.0 - 52.0 % Final  . MCV 01/15/2018 89.3  80.0 - 100.0 fL Final  . MCH 01/15/2018 28.9  26.0 - 34.0 pg Final  . MCHC 01/15/2018 32.4  30.0 - 36.0 g/dL Final  . RDW 01/15/2018 13.6  11.5 - 15.5 % Final  . Platelets 01/15/2018 157  150 - 400 K/uL Final  . nRBC 01/15/2018 0.1  0.0 - 0.2 % Final  . Neutrophils Relative % 01/15/2018 25  % Final  . Neutro Abs 01/15/2018 3.4  1.7 - 7.7 K/uL Final  . Lymphocytes Relative 01/15/2018 68  % Final  . Lymphs Abs 01/15/2018 9.5* 0.7 - 4.0 K/uL Final  . Monocytes Relative 01/15/2018 6  % Final  . Monocytes Absolute 01/15/2018 0.8  0.1 - 1.0 K/uL Final  . Eosinophils Relative 01/15/2018 1  % Final  . Eosinophils Absolute 01/15/2018 0.1  0.0 - 0.5 K/uL Final  . Basophils Relative 01/15/2018 0  % Final  . Basophils Absolute 01/15/2018 0.1  0.0 - 0.1 K/uL Final  . WBC Morphology 01/15/2018 ABSOLUTE LYMPHOCYTOSIS   Final   CONSISTANT WITH KNOW CLL  . Smear Review 01/15/2018 VARIANT LYMPHS NOTED ON SMEAR   Final  . Immature Granulocytes 01/15/2018 0  % Final  . Abs  Immature Granulocytes 01/15/2018 0.02  0.00 - 0.07 K/uL Final   Performed at [facility], [address] [place], [place] [address]    Assessment:  Connor Mays is a 84 y.o. male with stage 0 CLL.  He was diagnosed in 2010-2012 while living in [place], [place].  WBC has ranged between 17,000 - 22,000.  Flow cytometry on 01/12/2017 revealed a CD5 positive, CD23 positive clonal B-cell population with dim lambda light chain restriction, representing greater than 99% of the B cells and 70% of the leukocytes. Phenotype was typical of CLL/SLL. CD38 was expressed on a proximally 76% of clonal B cells.   There were no circulating blasts.  Symptimatically, patient is doing well. He denies any acute symptoms. Ankle edema persists. He takes PRN furosemide. No bruising or bleeding. Denies B symptoms or recent infections. Exam reveals to areas of palpable adenopathy or hepatosplenomegaly.   Plan: 1. Labs today:  CBC with diff. 2. Chronic lymphocytic leukemia (CLL)  Labs reviewed.  WBC 13,900 (ANC 3400, ALC 9500).  Discuss continued surveillance annually in the hematology clinic.  Patient to contact clinic for any concerning symptoms. 3. RTC in 1 year for MD assessment and labs (CBC with differential).   Honor Loh, NP  01/15/2018, 10:47 AM   I saw and evaluated the patient, participating in the key portions of the service and reviewing pertinent diagnostic studies and records.  I reviewed the nurse practitioner's note and agree with the findings and the plan.  The assessment and plan were discussed with the patient.  A few questions were asked by the patient and answered.   Nolon Stalls, MD 01/15/2018, 10:47 AM

## 2018-03-14 DIAGNOSIS — Z9842 Cataract extraction status, left eye: Secondary | ICD-10-CM

## 2018-03-14 DIAGNOSIS — Z9841 Cataract extraction status, right eye: Secondary | ICD-10-CM

## 2018-03-14 HISTORY — DX: Cataract extraction status, left eye: Z98.42

## 2018-03-14 HISTORY — DX: Cataract extraction status, right eye: Z98.41

## 2018-06-18 ENCOUNTER — Ambulatory Visit: Admit: 2018-06-18 | Payer: Medicare HMO | Admitting: Ophthalmology

## 2018-06-18 SURGERY — PHACOEMULSIFICATION, CATARACT, WITH IOL INSERTION
Anesthesia: Topical | Laterality: Right

## 2018-07-11 ENCOUNTER — Other Ambulatory Visit: Payer: Self-pay | Admitting: Neurology

## 2018-07-11 DIAGNOSIS — R4189 Other symptoms and signs involving cognitive functions and awareness: Secondary | ICD-10-CM

## 2018-08-07 ENCOUNTER — Encounter: Payer: Self-pay | Admitting: *Deleted

## 2018-08-07 ENCOUNTER — Other Ambulatory Visit: Payer: Self-pay

## 2018-08-09 ENCOUNTER — Other Ambulatory Visit
Admission: RE | Admit: 2018-08-09 | Discharge: 2018-08-09 | Disposition: A | Discharge status: Home / Self Care | Payer: Medicare HMO | Source: Ambulatory Visit | Attending: Ophthalmology | Admitting: Ophthalmology

## 2018-08-09 ENCOUNTER — Other Ambulatory Visit: Payer: Self-pay

## 2018-08-09 DIAGNOSIS — Z1159 Encounter for screening for other viral diseases: Secondary | ICD-10-CM | POA: Diagnosis present

## 2018-08-09 NOTE — Discharge Instructions (Signed)

## 2018-08-10 LAB — NOVEL CORONAVIRUS, NAA (HOSP ORDER, SEND-OUT TO REF LAB; TAT 18-24 HRS): SARS-CoV-2, NAA: NOT DETECTED

## 2018-08-13 ENCOUNTER — Ambulatory Visit: Payer: Medicare HMO | Admitting: Anesthesiology

## 2018-08-13 ENCOUNTER — Ambulatory Visit
Admission: RE | Admit: 2018-08-13 | Discharge: 2018-08-13 | Disposition: A | Discharge status: Home / Self Care | Payer: Medicare HMO | Attending: Ophthalmology | Admitting: Ophthalmology

## 2018-08-13 ENCOUNTER — Other Ambulatory Visit: Payer: Self-pay

## 2018-08-13 ENCOUNTER — Encounter
Admission: RE | Disposition: A | Discharge status: Home / Self Care | Payer: Self-pay | Source: Home / Self Care | Attending: Ophthalmology

## 2018-08-13 DIAGNOSIS — I10 Essential (primary) hypertension: Secondary | ICD-10-CM | POA: Diagnosis not present

## 2018-08-13 DIAGNOSIS — Z79899 Other long term (current) drug therapy: Secondary | ICD-10-CM | POA: Diagnosis not present

## 2018-08-13 DIAGNOSIS — Z955 Presence of coronary angioplasty implant and graft: Secondary | ICD-10-CM | POA: Insufficient documentation

## 2018-08-13 DIAGNOSIS — K219 Gastro-esophageal reflux disease without esophagitis: Secondary | ICD-10-CM | POA: Insufficient documentation

## 2018-08-13 DIAGNOSIS — I252 Old myocardial infarction: Secondary | ICD-10-CM | POA: Insufficient documentation

## 2018-08-13 DIAGNOSIS — Z87891 Personal history of nicotine dependence: Secondary | ICD-10-CM | POA: Insufficient documentation

## 2018-08-13 DIAGNOSIS — E039 Hypothyroidism, unspecified: Secondary | ICD-10-CM | POA: Insufficient documentation

## 2018-08-13 DIAGNOSIS — G2581 Restless legs syndrome: Secondary | ICD-10-CM | POA: Diagnosis not present

## 2018-08-13 DIAGNOSIS — Z951 Presence of aortocoronary bypass graft: Secondary | ICD-10-CM | POA: Diagnosis not present

## 2018-08-13 DIAGNOSIS — Z7982 Long term (current) use of aspirin: Secondary | ICD-10-CM | POA: Diagnosis not present

## 2018-08-13 DIAGNOSIS — Z885 Allergy status to narcotic agent status: Secondary | ICD-10-CM | POA: Diagnosis not present

## 2018-08-13 DIAGNOSIS — Z7902 Long term (current) use of antithrombotics/antiplatelets: Secondary | ICD-10-CM | POA: Insufficient documentation

## 2018-08-13 DIAGNOSIS — E7800 Pure hypercholesterolemia, unspecified: Secondary | ICD-10-CM | POA: Insufficient documentation

## 2018-08-13 DIAGNOSIS — H2511 Age-related nuclear cataract, right eye: Secondary | ICD-10-CM | POA: Diagnosis not present

## 2018-08-13 DIAGNOSIS — I2510 Atherosclerotic heart disease of native coronary artery without angina pectoris: Secondary | ICD-10-CM | POA: Diagnosis not present

## 2018-08-13 HISTORY — PX: CATARACT EXTRACTION W/PHACO: SHX586

## 2018-08-13 HISTORY — DX: Unspecified osteoarthritis, unspecified site: M19.90

## 2018-08-13 HISTORY — DX: Gastro-esophageal reflux disease without esophagitis: K21.9

## 2018-08-13 HISTORY — DX: Hyperlipidemia, unspecified: E78.5

## 2018-08-13 HISTORY — DX: Hypothyroidism, unspecified: E03.9

## 2018-08-13 SURGERY — PHACOEMULSIFICATION, CATARACT, WITH IOL INSERTION
Anesthesia: Monitor Anesthesia Care | Site: Eye | Laterality: Right

## 2018-08-13 MED ORDER — PROMETHAZINE HCL 25 MG/ML IJ SOLN: 6.2500 mg | INTRAMUSCULAR | Status: DC | PRN

## 2018-08-13 MED ORDER — SODIUM HYALURONATE 10 MG/ML IO SOLN
INTRAOCULAR | Status: DC | PRN
  Administered 2018-08-13: 0.55 mL via INTRAOCULAR

## 2018-08-13 MED ORDER — EPINEPHRINE PF 1 MG/ML IJ SOLN
INTRAOCULAR | Status: DC | PRN
  Administered 2018-08-13: 78 mL via OPHTHALMIC

## 2018-08-13 MED ORDER — FENTANYL CITRATE (PF) 100 MCG/2ML IJ SOLN
INTRAMUSCULAR | Status: DC | PRN
  Administered 2018-08-13: 25 ug via INTRAVENOUS

## 2018-08-13 MED ORDER — MIDAZOLAM HCL 2 MG/2ML IJ SOLN
INTRAMUSCULAR | Status: DC | PRN
  Administered 2018-08-13: 1 mg via INTRAVENOUS

## 2018-08-13 MED ORDER — TETRACAINE HCL 0.5 % OP SOLN
1.0000 [drp] | OPHTHALMIC | Status: DC | PRN
  Administered 2018-08-13 (×3): 1 [drp] via OPHTHALMIC

## 2018-08-13 MED ORDER — SODIUM HYALURONATE 23 MG/ML IO SOLN
INTRAOCULAR | Status: DC | PRN
  Administered 2018-08-13: 0.6 mL via INTRAOCULAR

## 2018-08-13 MED ORDER — FENTANYL CITRATE (PF) 100 MCG/2ML IJ SOLN: 25.0000 ug | INTRAMUSCULAR | Status: DC | PRN

## 2018-08-13 MED ORDER — OXYCODONE HCL 5 MG/5ML PO SOLN: 5.0000 mg | Freq: Once | ORAL | Status: DC | PRN

## 2018-08-13 MED ORDER — ARMC OPHTHALMIC DILATING DROPS
1.0000 "application " | OPHTHALMIC | Status: DC | PRN
  Administered 2018-08-13 (×3): 1 via OPHTHALMIC

## 2018-08-13 MED ORDER — LIDOCAINE HCL (PF) 2 % IJ SOLN
INTRAOCULAR | Status: DC | PRN
  Administered 2018-08-13: 2 mL via INTRAOCULAR

## 2018-08-13 MED ORDER — LACTATED RINGERS IV SOLN: 10.0000 mL/h | INTRAVENOUS | Status: DC

## 2018-08-13 MED ORDER — OXYCODONE HCL 5 MG PO TABS: 5.0000 mg | ORAL_TABLET | Freq: Once | ORAL | Status: DC | PRN

## 2018-08-13 MED ORDER — MEPERIDINE HCL 25 MG/ML IJ SOLN: 6.2500 mg | INTRAMUSCULAR | Status: DC | PRN

## 2018-08-13 MED ORDER — MOXIFLOXACIN HCL 0.5 % OP SOLN
OPHTHALMIC | Status: DC | PRN
  Administered 2018-08-13: 0.2 mL via OPHTHALMIC

## 2018-08-13 SURGICAL SUPPLY — 19 items
CANNULA ANT/CHMB 27G (MISCELLANEOUS) ×2 IMPLANT
CANNULA ANT/CHMB 27GA (MISCELLANEOUS) ×6 IMPLANT
DISSECTOR HYDRO NUCLEUS 50X22 (MISCELLANEOUS) ×3 IMPLANT
GLOVE SURG LX 7.5 STRW (GLOVE) ×2
GLOVE SURG LX STRL 7.5 STRW (GLOVE) ×1 IMPLANT
GLOVE SURG SYN 8.5  E (GLOVE) ×2
GLOVE SURG SYN 8.5 E (GLOVE) ×1 IMPLANT
GLOVE SURG SYN 8.5 PF PI (GLOVE) ×1 IMPLANT
GOWN STRL REUS W/ TWL LRG LVL3 (GOWN DISPOSABLE) ×2 IMPLANT
GOWN STRL REUS W/TWL LRG LVL3 (GOWN DISPOSABLE) ×4
LENS IOL TECNIS ITEC 22.5 (Intraocular Lens) ×2 IMPLANT
MARKER SKIN DUAL TIP RULER LAB (MISCELLANEOUS) ×3 IMPLANT
PACK DR. KING ARMS (PACKS) ×3 IMPLANT
PACK EYE AFTER SURG (MISCELLANEOUS) ×3 IMPLANT
PACK OPTHALMIC (MISCELLANEOUS) ×3 IMPLANT
SYR 3ML LL SCALE MARK (SYRINGE) ×3 IMPLANT
SYR TB 1ML LUER SLIP (SYRINGE) ×3 IMPLANT
WATER STERILE IRR 250ML POUR (IV SOLUTION) ×3 IMPLANT
WIPE NON LINTING 3.25X3.25 (MISCELLANEOUS) ×3 IMPLANT

## 2018-08-13 NOTE — Op Note (Signed)
OPERATIVE NOTE  Connor Mays [id number] 08/13/2018   PREOPERATIVE DIAGNOSIS:  Nuclear sclerotic cataract right eye.  H25.11   POSTOPERATIVE DIAGNOSIS:    Nuclear sclerotic cataract right eye.     PROCEDURE:  Phacoemusification with posterior chamber intraocular lens placement of the right eye   LENS:   Implant Name Type Inv. Item Serial No. Manufacturer Lot No. LRB No. Used  LENS IOL DIOP 22.5 - [id number] Intraocular Lens LENS IOL DIOP 22.5 [id number] AMO  Right 1       PCB00 +22.5   ULTRASOUND TIME: 1 minutes 11 seconds.  CDE 13.88   SURGEON:  Benay Pillow, MD, MPH  ANESTHESIOLOGIST: Anesthesiologist: Marice Potter, MD CRNA: Cameron Ali, CRNA   ANESTHESIA:  Topical with tetracaine drops augmented with 1% preservative-free intracameral lidocaine.  ESTIMATED BLOOD LOSS: less than 1 mL.   COMPLICATIONS:  None.   DESCRIPTION OF PROCEDURE:  The patient was identified in the holding room and transported to the operating room and placed in the supine position under the operating microscope.  The right eye was identified as the operative eye and it was prepped and draped in the usual sterile ophthalmic fashion.   A 1.0 millimeter clear-corneal paracentesis was made at the 10:30 position. 0.5 ml of preservative-free 1% lidocaine with epinephrine was injected into the anterior chamber.  The anterior chamber was filled with Healon 5 viscoelastic.  A 2.4 millimeter keratome was used to make a near-clear corneal incision at the 8:00 position.  A curvilinear capsulorrhexis was made with a cystotome and capsulorrhexis forceps.  Balanced salt solution was used to hydrodissect and hydrodelineate the nucleus.   Phacoemulsification was then used in stop and chop fashion to remove the lens nucleus and epinucleus.  The remaining cortex was then removed using the irrigation and aspiration handpiece. Healon was then placed into the capsular bag to distend it for lens placement.  A lens was  then injected into the capsular bag.  The remaining viscoelastic was aspirated.   Wounds were hydrated with balanced salt solution.  The anterior chamber was inflated to a physiologic pressure with balanced salt solution.   Intracameral vigamox 0.1 mL undiluted was injected into the eye and a drop placed onto the ocular surface.  No wound leaks were noted.  The patient was taken to the recovery room in stable condition without complications of anesthesia or surgery  Benay Pillow 08/13/2018, 10:33 AM

## 2018-08-13 NOTE — Anesthesia Postprocedure Evaluation (Signed)
Anesthesia Post Note  Patient: Connor Mays  Procedure(s) Performed: CATARACT EXTRACTION PHACO AND INTRAOCULAR LENS PLACEMENT (IOC) RIGHT (Right Eye)  Patient location during evaluation: PACU Anesthesia Type: MAC Level of consciousness: awake and alert Pain management: pain level controlled Vital Signs Assessment: post-procedure vital signs reviewed and stable Respiratory status: spontaneous breathing, nonlabored ventilation, respiratory function stable and patient connected to nasal cannula oxygen Cardiovascular status: stable and blood pressure returned to baseline Postop Assessment: no apparent nausea or vomiting Anesthetic complications: no    SCOURAS, NICOLE ELAINE

## 2018-08-13 NOTE — H&P (Signed)

## 2018-08-13 NOTE — Anesthesia Preprocedure Evaluation (Signed)
Anesthesia Evaluation  Patient identified by MRN, date of birth, ID band Patient awake    Reviewed: Allergy & Precautions, H&P , NPO status , Patient's Chart, lab work & pertinent test results, reviewed documented beta blocker date and time   Airway Mallampati: II  TM Distance: >3 FB Neck ROM: full    Dental no notable dental hx.    Pulmonary neg pulmonary ROS, former smoker,    Pulmonary exam normal breath sounds clear to auscultation       Cardiovascular Exercise Tolerance: Good + CAD and + Past MI   Rhythm:regular Rate:Normal     Neuro/Psych negative neurological ROS  negative psych ROS   GI/Hepatic Neg liver ROS, GERD  ,  Endo/Other  Hypothyroidism   Renal/GU negative Renal ROS  negative genitourinary   Musculoskeletal   Abdominal   Peds  Hematology CLL   Anesthesia Other Findings   Reproductive/Obstetrics negative OB ROS                             Anesthesia Physical Anesthesia Plan  ASA: III  Anesthesia Plan: MAC   Post-op Pain Management:    Induction:   PONV Risk Score and Plan:   Airway Management Planned:   Additional Equipment:   Intra-op Plan:   Post-operative Plan:   Informed Consent: I have reviewed the patients History and Physical, chart, labs and discussed the procedure including the risks, benefits and alternatives for the proposed anesthesia with the patient or authorized representative who has indicated his/her understanding and acceptance.     Dental Advisory Given  Plan Discussed with: CRNA  Anesthesia Plan Comments:         Anesthesia Quick Evaluation

## 2018-08-13 NOTE — Transfer of Care (Signed)
Immediate Anesthesia Transfer of Care Note  Patient: Connor Mays  Procedure(s) Performed: CATARACT EXTRACTION PHACO AND INTRAOCULAR LENS PLACEMENT (IOC) RIGHT (Right Eye)  Patient Location: PACU  Anesthesia Type: MAC  Level of Consciousness: awake, alert  and patient cooperative  Airway and Oxygen Therapy: Patient Spontanous Breathing and Patient connected to supplemental oxygen  Post-op Assessment: Post-op Vital signs reviewed, Patient's Cardiovascular Status Stable, Respiratory Function Stable, Patent Airway and No signs of Nausea or vomiting  Post-op Vital Signs: Reviewed and stable  Complications: No apparent anesthesia complications

## 2018-08-13 NOTE — Anesthesia Procedure Notes (Signed)
Procedure Name: MAC Date/Time: 08/13/2018 10:09 AM Performed by: Cameron Ali, CRNA Pre-anesthesia Checklist: Patient identified, Emergency Drugs available, Suction available, Timeout performed and Patient being monitored Patient Re-evaluated:Patient Re-evaluated prior to induction Oxygen Delivery Method: Nasal cannula Placement Confirmation: positive ETCO2

## 2018-08-14 ENCOUNTER — Encounter: Payer: Self-pay | Admitting: Ophthalmology

## 2018-08-24 ENCOUNTER — Other Ambulatory Visit: Payer: Self-pay

## 2018-08-24 ENCOUNTER — Ambulatory Visit
Admission: RE | Admit: 2018-08-24 | Discharge: 2018-08-24 | Disposition: A | Discharge status: Home / Self Care | Payer: Medicare HMO | Source: Ambulatory Visit | Attending: Neurology | Admitting: Neurology

## 2018-08-24 DIAGNOSIS — R4189 Other symptoms and signs involving cognitive functions and awareness: Secondary | ICD-10-CM | POA: Diagnosis present

## 2018-09-10 ENCOUNTER — Other Ambulatory Visit: Payer: Self-pay

## 2018-09-10 ENCOUNTER — Encounter: Payer: Self-pay | Admitting: *Deleted

## 2018-09-13 ENCOUNTER — Other Ambulatory Visit
Admission: RE | Admit: 2018-09-13 | Discharge: 2018-09-13 | Disposition: A | Discharge status: Home / Self Care | Payer: Medicare HMO | Source: Ambulatory Visit | Attending: Ophthalmology | Admitting: Ophthalmology

## 2018-09-13 ENCOUNTER — Other Ambulatory Visit: Payer: Self-pay

## 2018-09-13 DIAGNOSIS — Z01812 Encounter for preprocedural laboratory examination: Secondary | ICD-10-CM | POA: Diagnosis present

## 2018-09-13 DIAGNOSIS — Z1159 Encounter for screening for other viral diseases: Secondary | ICD-10-CM | POA: Insufficient documentation

## 2018-09-13 NOTE — Discharge Instructions (Signed)

## 2018-09-14 LAB — SARS CORONAVIRUS 2 (TAT 6-24 HRS): SARS Coronavirus 2: NEGATIVE

## 2018-09-17 ENCOUNTER — Ambulatory Visit
Admission: RE | Admit: 2018-09-17 | Discharge: 2018-09-17 | Disposition: A | Discharge status: Home / Self Care | Payer: Medicare HMO | Attending: Ophthalmology | Admitting: Ophthalmology

## 2018-09-17 ENCOUNTER — Ambulatory Visit: Payer: Medicare HMO | Admitting: Anesthesiology

## 2018-09-17 ENCOUNTER — Encounter
Admission: RE | Disposition: A | Discharge status: Home / Self Care | Payer: Self-pay | Source: Home / Self Care | Attending: Ophthalmology

## 2018-09-17 DIAGNOSIS — Z79899 Other long term (current) drug therapy: Secondary | ICD-10-CM | POA: Diagnosis not present

## 2018-09-17 DIAGNOSIS — Z79891 Long term (current) use of opiate analgesic: Secondary | ICD-10-CM | POA: Diagnosis not present

## 2018-09-17 DIAGNOSIS — I252 Old myocardial infarction: Secondary | ICD-10-CM | POA: Diagnosis not present

## 2018-09-17 DIAGNOSIS — Z955 Presence of coronary angioplasty implant and graft: Secondary | ICD-10-CM | POA: Diagnosis not present

## 2018-09-17 DIAGNOSIS — Z87891 Personal history of nicotine dependence: Secondary | ICD-10-CM | POA: Insufficient documentation

## 2018-09-17 DIAGNOSIS — H2512 Age-related nuclear cataract, left eye: Secondary | ICD-10-CM | POA: Diagnosis present

## 2018-09-17 DIAGNOSIS — E039 Hypothyroidism, unspecified: Secondary | ICD-10-CM | POA: Insufficient documentation

## 2018-09-17 DIAGNOSIS — I10 Essential (primary) hypertension: Secondary | ICD-10-CM | POA: Diagnosis not present

## 2018-09-17 DIAGNOSIS — C9110 Chronic lymphocytic leukemia of B-cell type not having achieved remission: Secondary | ICD-10-CM | POA: Diagnosis not present

## 2018-09-17 DIAGNOSIS — Z951 Presence of aortocoronary bypass graft: Secondary | ICD-10-CM | POA: Diagnosis not present

## 2018-09-17 DIAGNOSIS — Z885 Allergy status to narcotic agent status: Secondary | ICD-10-CM | POA: Insufficient documentation

## 2018-09-17 DIAGNOSIS — K219 Gastro-esophageal reflux disease without esophagitis: Secondary | ICD-10-CM | POA: Insufficient documentation

## 2018-09-17 DIAGNOSIS — M1990 Unspecified osteoarthritis, unspecified site: Secondary | ICD-10-CM | POA: Insufficient documentation

## 2018-09-17 DIAGNOSIS — Z79890 Hormone replacement therapy (postmenopausal): Secondary | ICD-10-CM | POA: Insufficient documentation

## 2018-09-17 DIAGNOSIS — I2510 Atherosclerotic heart disease of native coronary artery without angina pectoris: Secondary | ICD-10-CM | POA: Diagnosis not present

## 2018-09-17 DIAGNOSIS — E7800 Pure hypercholesterolemia, unspecified: Secondary | ICD-10-CM | POA: Insufficient documentation

## 2018-09-17 DIAGNOSIS — Z7902 Long term (current) use of antithrombotics/antiplatelets: Secondary | ICD-10-CM | POA: Diagnosis not present

## 2018-09-17 HISTORY — PX: CATARACT EXTRACTION W/PHACO: SHX586

## 2018-09-17 SURGERY — PHACOEMULSIFICATION, CATARACT, WITH IOL INSERTION
Anesthesia: Monitor Anesthesia Care | Site: Eye | Laterality: Left

## 2018-09-17 MED ORDER — MOXIFLOXACIN HCL 0.5 % OP SOLN
OPHTHALMIC | Status: DC | PRN
  Administered 2018-09-17: 0.2 mL via OPHTHALMIC

## 2018-09-17 MED ORDER — MIDAZOLAM HCL 2 MG/2ML IJ SOLN
INTRAMUSCULAR | Status: DC | PRN
  Administered 2018-09-17: 1 mg via INTRAVENOUS

## 2018-09-17 MED ORDER — LIDOCAINE HCL (PF) 2 % IJ SOLN
INTRAOCULAR | Status: DC | PRN
  Administered 2018-09-17: 1 mL via INTRAOCULAR

## 2018-09-17 MED ORDER — EPINEPHRINE PF 1 MG/ML IJ SOLN
INTRAOCULAR | Status: DC | PRN
  Administered 2018-09-17: 11:00:00 79 mL via OPHTHALMIC

## 2018-09-17 MED ORDER — SODIUM HYALURONATE 10 MG/ML IO SOLN
INTRAOCULAR | Status: DC | PRN
  Administered 2018-09-17: 0.55 mL via INTRAOCULAR

## 2018-09-17 MED ORDER — SODIUM HYALURONATE 23 MG/ML IO SOLN
INTRAOCULAR | Status: DC | PRN
  Administered 2018-09-17: 0.6 mL via INTRAOCULAR

## 2018-09-17 MED ORDER — LACTATED RINGERS IV SOLN: 10.0000 mL/h | INTRAVENOUS | Status: DC

## 2018-09-17 MED ORDER — FENTANYL CITRATE (PF) 100 MCG/2ML IJ SOLN
INTRAMUSCULAR | Status: DC | PRN
  Administered 2018-09-17: 50 ug via INTRAVENOUS

## 2018-09-17 MED ORDER — TETRACAINE HCL 0.5 % OP SOLN
1.0000 [drp] | OPHTHALMIC | Status: DC | PRN
  Administered 2018-09-17 (×3): 1 [drp] via OPHTHALMIC

## 2018-09-17 MED ORDER — ARMC OPHTHALMIC DILATING DROPS
1.0000 "application " | OPHTHALMIC | Status: DC | PRN
  Administered 2018-09-17 (×3): 1 via OPHTHALMIC

## 2018-09-17 SURGICAL SUPPLY — 19 items
CANNULA ANT/CHMB 27G (MISCELLANEOUS) ×2 IMPLANT
CANNULA ANT/CHMB 27GA (MISCELLANEOUS) ×4 IMPLANT
DISSECTOR HYDRO NUCLEUS 50X22 (MISCELLANEOUS) ×2 IMPLANT
GLOVE SURG LX 7.5 STRW (GLOVE) ×1
GLOVE SURG LX STRL 7.5 STRW (GLOVE) ×1 IMPLANT
GLOVE SURG SYN 8.5  E (GLOVE) ×1
GLOVE SURG SYN 8.5 E (GLOVE) ×1 IMPLANT
GLOVE SURG SYN 8.5 PF PI (GLOVE) ×1 IMPLANT
GOWN STRL REUS W/ TWL LRG LVL3 (GOWN DISPOSABLE) ×2 IMPLANT
GOWN STRL REUS W/TWL LRG LVL3 (GOWN DISPOSABLE) ×2
LENS IOL TECNIS ITEC 22.5 (Intraocular Lens) ×1 IMPLANT
MARKER SKIN DUAL TIP RULER LAB (MISCELLANEOUS) ×2 IMPLANT
PACK DR. KING ARMS (PACKS) ×2 IMPLANT
PACK EYE AFTER SURG (MISCELLANEOUS) ×2 IMPLANT
PACK OPTHALMIC (MISCELLANEOUS) ×2 IMPLANT
SYR 3ML LL SCALE MARK (SYRINGE) ×2 IMPLANT
SYR TB 1ML LUER SLIP (SYRINGE) ×2 IMPLANT
WATER STERILE IRR 250ML POUR (IV SOLUTION) ×2 IMPLANT
WIPE NON LINTING 3.25X3.25 (MISCELLANEOUS) ×2 IMPLANT

## 2018-09-17 NOTE — Transfer of Care (Signed)
Immediate Anesthesia Transfer of Care Note  Patient: Connor Mays  Procedure(s) Performed: CATARACT EXTRACTION PHACO AND INTRAOCULAR LENS PLACEMENT (IOC)  LEFT (Left Eye)  Patient Location: PACU  Anesthesia Type: MAC  Level of Consciousness: awake, alert  and patient cooperative  Airway and Oxygen Therapy: Patient Spontanous Breathing and Patient connected to supplemental oxygen  Post-op Assessment: Post-op Vital signs reviewed, Patient's Cardiovascular Status Stable, Respiratory Function Stable, Patent Airway and No signs of Nausea or vomiting  Post-op Vital Signs: Reviewed and stable  Complications: No apparent anesthesia complications

## 2018-09-17 NOTE — Anesthesia Procedure Notes (Signed)
Procedure Name: MAC Date/Time: 09/17/2018 10:48 AM Performed by: Cameron Ali, CRNA Pre-anesthesia Checklist: Patient identified, Emergency Drugs available, Suction available, Timeout performed and Patient being monitored Patient Re-evaluated:Patient Re-evaluated prior to induction Oxygen Delivery Method: Nasal cannula Placement Confirmation: positive ETCO2

## 2018-09-17 NOTE — H&P (Signed)

## 2018-09-17 NOTE — Anesthesia Preprocedure Evaluation (Signed)
Anesthesia Evaluation  Patient identified by MRN, date of birth, ID band Patient awake    Reviewed: Allergy & Precautions, H&P , NPO status , Patient's Chart, lab work & pertinent test results, reviewed documented beta blocker date and time   Airway Mallampati: II  TM Distance: >3 FB Neck ROM: full    Dental no notable dental hx.    Pulmonary neg pulmonary ROS, former smoker,    Pulmonary exam normal breath sounds clear to auscultation       Cardiovascular Exercise Tolerance: Good + CAD and + Past MI  Normal cardiovascular exam Rhythm:regular Rate:Normal     Neuro/Psych negative neurological ROS  negative psych ROS   GI/Hepatic Neg liver ROS, GERD  ,  Endo/Other  Hypothyroidism   Renal/GU negative Renal ROS  negative genitourinary   Musculoskeletal   Abdominal   Peds  Hematology CLL   Anesthesia Other Findings   Reproductive/Obstetrics negative OB ROS                             Anesthesia Physical  Anesthesia Plan  ASA: III  Anesthesia Plan: MAC   Post-op Pain Management:    Induction:   PONV Risk Score and Plan: 1 and Midazolam and Treatment may vary due to age or medical condition  Airway Management Planned:   Additional Equipment:   Intra-op Plan:   Post-operative Plan:   Informed Consent: I have reviewed the patients History and Physical, chart, labs and discussed the procedure including the risks, benefits and alternatives for the proposed anesthesia with the patient or authorized representative who has indicated his/her understanding and acceptance.     Dental Advisory Given  Plan Discussed with: CRNA  Anesthesia Plan Comments:         Anesthesia Quick Evaluation

## 2018-09-17 NOTE — Anesthesia Postprocedure Evaluation (Signed)
Anesthesia Post Note  Patient: Connor Mays  Procedure(s) Performed: CATARACT EXTRACTION PHACO AND INTRAOCULAR LENS PLACEMENT (IOC)  LEFT (Left Eye)  Patient location during evaluation: PACU Anesthesia Type: MAC Level of consciousness: awake and alert and oriented Pain management: satisfactory to patient Vital Signs Assessment: post-procedure vital signs reviewed and stable Respiratory status: spontaneous breathing, nonlabored ventilation and respiratory function stable Cardiovascular status: blood pressure returned to baseline and stable Postop Assessment: Adequate PO intake and No signs of nausea or vomiting Anesthetic complications: no    Raliegh Ip

## 2018-09-17 NOTE — Op Note (Signed)
OPERATIVE NOTE  Connor Mays [id number] 09/17/2018   PREOPERATIVE DIAGNOSIS:  Nuclear sclerotic cataract left eye.  H25.12   POSTOPERATIVE DIAGNOSIS:    Nuclear sclerotic cataract left eye.     PROCEDURE:  Phacoemusification with posterior chamber intraocular lens placement of the left eye   LENS:   Implant Name Type Inv. Item Serial No. Manufacturer Lot No. LRB No. Used Action  LENS IOL DIOP 22.5 - [id number] Intraocular Lens LENS IOL DIOP 22.5 [id number] AMO  Left 1 Implanted       PCB00 +22.5   ULTRASOUND TIME: 0 minutes 59 seconds.  CDE 8.92   SURGEON:  Benay Pillow, MD, MPH   ANESTHESIA:  Topical with tetracaine drops augmented with 1% preservative-free intracameral lidocaine.  ESTIMATED BLOOD LOSS: <1 mL   COMPLICATIONS:  None.   DESCRIPTION OF PROCEDURE:  The patient was identified in the holding room and transported to the operating room and placed in the supine position under the operating microscope.  The left eye was identified as the operative eye and it was prepped and draped in the usual sterile ophthalmic fashion.   A 1.0 millimeter clear-corneal paracentesis was made at the 5:00 position. 0.5 ml of preservative-free 1% lidocaine with epinephrine was injected into the anterior chamber.  The anterior chamber was filled with Healon 5 viscoelastic.  A 2.4 millimeter keratome was used to make a near-clear corneal incision at the 2:00 position.  A curvilinear capsulorrhexis was made with a cystotome and capsulorrhexis forceps.  Balanced salt solution was used to hydrodissect and hydrodelineate the nucleus.   Phacoemulsification was then used in stop and chop fashion to remove the lens nucleus and epinucleus.  The remaining cortex was then removed using the irrigation and aspiration handpiece. Healon was then placed into the capsular bag to distend it for lens placement.  A lens was then injected into the capsular bag.  The remaining viscoelastic was aspirated.   Wounds  were hydrated with balanced salt solution.  The anterior chamber was inflated to a physiologic pressure with balanced salt solution.  Intracameral vigamox 0.1 mL undiltued was injected into the eye and a drop placed onto the ocular surface.  No wound leaks were noted.  The patient was taken to the recovery room in stable condition without complications of anesthesia or surgery  Benay Pillow 09/17/2018, 11:09 AM

## 2018-09-18 ENCOUNTER — Encounter: Payer: Self-pay | Admitting: Ophthalmology

## 2019-01-17 ENCOUNTER — Other Ambulatory Visit: Payer: Self-pay

## 2019-01-17 NOTE — Progress Notes (Signed)
Confirmed Name, DOB, Address. Denies any concerns.

## 2019-01-17 NOTE — Progress Notes (Signed)
[facility]  [address], [address] [place], [place] [address] Phone: [phone]  Fax: [phone]   Clinic Day:  01/18/2019  Referring physician: Glendon Axe, MD  Chief Complaint: Connor Mays is a 85 y.o. male with chronic lymphocytic leukemia (CLL) who is seen for 1 year assessment   HPI: The patient was last seen in the medical oncology clinic on 01/15/2018. At that time, he was doing well. He denied any acute symptoms. Ankle edema was persisted. He denied any bruising or bleeding. He denied B symptoms or recent infections. Exam revealed to areas of palpable adenopathy or hepatosplenomegaly.   Hematocrit was 42.6, hemoglobin 13.8, MCV 89.3, platelets 157,000, white count 13,900 (ANC 3400; ALC 9500).  He was seen by Dr Manuella Ghazi on 06/14/2018.  Head MRI was ordered for cognitive impairment. Head MRI wo contrast on 08/24/2018 revealed mild chronic small vessel disease without acute intracranial abnormality.  He underwent right cataract surgery on 08/13/2018 by Dr. Benay Pillow. Cataract surgery on the left eye was performed on 09/17/2018  During the interim, he's been fine. He's had his flu vaccine. He denies any symptoms. He has been dieting, with fasting. His goal weight is 200 lbs. At home, his weight is 209 pounds. He has been able to see much better since his cataract surgery.   Symptomatically, he feels good today. He has been staying at home to stay away from COVID-19. He has chronic back pain that bothers him every morning. After he starts his day, he is able to keep moving.  He noted some firm lumps on his chest that his doctor in [place] told him not to be concerned.  He has had the lumps for 20+ years but wanted to make doctor aware during the exam.   Past Medical History:  Diagnosis Date  . Arthritis    lower back  . CAD (coronary artery disease)   . GERD (gastroesophageal reflux disease)   . Heart attack ([place]) 1988  . Hyperlipidemia   .  Hypothyroidism     Past Surgical History:  Procedure Laterality Date  . CATARACT EXTRACTION W/PHACO Right 08/13/2018   Procedure: CATARACT EXTRACTION PHACO AND INTRAOCULAR LENS PLACEMENT ([place]) RIGHT;  Surgeon: Eulogio Bear, MD;  Location: [facility];  Service: Ophthalmology;  Laterality: Right;  . CATARACT EXTRACTION W/PHACO Left 09/17/2018   Procedure: CATARACT EXTRACTION PHACO AND INTRAOCULAR LENS PLACEMENT ([place])  LEFT;  Surgeon: Eulogio Bear, MD;  Location: [facility];  Service: Ophthalmology;  Laterality: Left;  . CORONARY STENT PLACEMENT  2007  . heart bypass  1988  . HERNIA REPAIR    . LEFT HEART CATH AND CORS/GRAFTS ANGIOGRAPHY N/A 05/02/2017   Procedure: LEFT HEART CATH AND CORS/GRAFTS ANGIOGRAPHY;  Surgeon: Yolonda Kida, MD;  Location: [facility];  Service: Cardiovascular;  Laterality: N/A;  . TONSILLECTOMY      Family History  Problem Relation Age of Onset  . Healthy Sister   . Bladder Cancer Brother   . Healthy Sister     Social History:  reports that he has quit smoking. His smoking use included cigars. He quit smokeless tobacco use about 50 years ago. He reports that he does not drink alcohol or use drugs. Patient is a former pipe and cigar smoker; quit 50 years ago. Patient infrequently drinks beer. Patient denies known exposure to radiation and toxins. Patient is a retired Conservation officer, nature; worked for [employer].  He lives in [place].  The patient is alone  today.  Allergies:  Allergies  Allergen Reactions  . Morphine Other (See Comments)    Pre-syncope    Current Medications: Current Outpatient Medications  Medication Sig Dispense Refill  . acetaminophen (TYLENOL) 500 MG tablet Take 500 mg by mouth every 6 (six) hours as needed.     . Ascorbic Acid (VITAMIN C) 1000 MG tablet Take 1,000 mg by mouth daily.     Marland Kitchen aspirin EC 81 MG tablet Take 81 mg by mouth daily.    . cetirizine (ZYRTEC) 10 MG tablet Take 10 mg by mouth daily as  needed for allergies.    . Cholecalciferol (VITAMIN D3) 1000 units CAPS Take 1,000 Units by mouth daily.     . clopidogrel (PLAVIX) 75 MG tablet Take 75 mg by mouth daily.     . fexofenadine (ALLEGRA) 180 MG tablet Take 180 mg by mouth daily.     . furosemide (LASIX) 20 MG tablet Take 20 mg by mouth as needed.    . gabapentin (NEURONTIN) 300 MG capsule Take 300 mg by mouth daily.    Marland Kitchen levothyroxine (SYNTHROID, LEVOTHROID) 50 MCG tablet Take 50 mcg by mouth daily before breakfast.     . losartan (COZAAR) 100 MG tablet Take 100 mg by mouth daily.     . Multiple Vitamins-Minerals (PRESERVISION AREDS PO) Take 1 tablet by mouth daily.     . pantoprazole (PROTONIX) 40 MG tablet Take 40 mg by mouth as needed.     . potassium chloride (KLOR-CON) 10 MEQ tablet TAKE 1 TABLET BY MOUTH ONCE DAILY AS NEEDED    . rOPINIRole (REQUIP) 0.5 MG tablet Take 1 mg by mouth at bedtime.  3  . simvastatin (ZOCOR) 40 MG tablet Take 40 mg by mouth daily.    . tamsulosin (FLOMAX) 0.4 MG CAPS capsule Take 0.4 mg by mouth daily after supper.    . traMADol (ULTRAM) 50 MG tablet Take by mouth every 6 (six) hours as needed.     No current facility-administered medications for this visit.     Review of Systems  Constitutional: Negative for chills, diaphoresis, fever, malaise/fatigue and weight loss (16 pounds intentionally due to diet and fasting).       Feels "fine".  HENT: Negative.  Negative for congestion, ear pain, nosebleeds, sinus pain and sore throat.   Eyes: Negative.  Negative for blurred vision and double vision.       Interval cataract surgery.  Respiratory: Negative.  Negative for cough, hemoptysis, sputum production and shortness of breath.   Cardiovascular: Negative.  Negative for chest pain, palpitations, orthopnea, leg swelling (ankles) and PND.  Gastrointestinal: Negative.  Negative for abdominal pain, blood in stool, constipation, diarrhea, melena, nausea and vomiting.  Genitourinary: Negative.   Negative for dysuria, frequency, hematuria and urgency.  Musculoskeletal: Positive for back pain (chronic lower back ache; pain 2 of 10). Negative for falls, joint pain and myalgias.  Skin: Negative.  Negative for itching and rash.  Neurological: Negative.  Negative for dizziness, tremors, speech change, focal weakness, weakness and headaches.  Endo/Heme/Allergies: Does not bruise/bleed easily.       HYPOthyroidism on levothyroxine.  Psychiatric/Behavioral: Negative.  Negative for depression and memory loss. The patient is not nervous/anxious and does not have insomnia.   All other systems reviewed and are negative.  Performance status (ECOG): 1  Vitals Blood pressure 136/69, pulse 68, temperature (!) 96.8 F (36 C), temperature source Tympanic, resp. rate 18, height 5\' 10"  (1.778 m), weight 216 lb 6.1 oz (  98.1 kg), SpO2 98 %.   Physical Exam  Constitutional: He is oriented to person, place, and time. He appears well-developed and well-nourished. No distress.  HENT:  Head: Normocephalic and atraumatic.  Mouth/Throat: No oropharyngeal exudate.  Gray hair.  Mask.  Eyes: Pupils are equal, round, and reactive to light. Conjunctivae and EOM are normal. No scleral icterus.  Glasses.  Neck: Normal range of motion. No JVD present.  Cardiovascular: Normal rate, regular rhythm and normal heart sounds. Exam reveals no gallop.  No murmur heard. Pulmonary/Chest: Breath sounds normal. No respiratory distress. He has no wheezes. He has no rales.  Multiple soft nodules (? lipomas) on the right breast including: 1.5 cm 4 cm from nipple at 1 o'clock; 5 mm 8 cm form nipple at 9 o'clock; 18 mm at 3 o'clock; 8 mm 4.5 cm from nipple at 9:30 o'clock.  Left sided breast nodules: 1 cm and 5 mm at 5:30 o'clock and 6:30 o'clock.  Multiple additional nodules not measured.  Abdominal: Soft. Bowel sounds are normal. He exhibits no distension and no mass. There is no abdominal tenderness. There is no rebound and no  guarding.  Musculoskeletal: Normal range of motion.        General: No edema.  Lymphadenopathy:       Head (right side): No preauricular, no posterior auricular and no occipital adenopathy present.       Head (left side): No preauricular, no posterior auricular and no occipital adenopathy present.    He has no cervical adenopathy.    He has no axillary adenopathy.       Right: No inguinal and no supraclavicular adenopathy present.       Left: No inguinal and no supraclavicular adenopathy present.  Neurological: He is alert and oriented to person, place, and time.  Skin: Skin is warm and dry. No rash noted. He is not diaphoretic. No erythema. No pallor.  Psychiatric: He has a normal mood and affect. His behavior is normal. Judgment and thought content normal.  Nursing note and vitals reviewed.   Appointment on 01/18/2019  Component Date Value Ref Range Status  . WBC 01/18/2019 14.8* 4.0 - 10.5 K/uL Final  . RBC 01/18/2019 4.87  4.22 - 5.81 MIL/uL Final  . Hemoglobin 01/18/2019 14.3  13.0 - 17.0 g/dL Final  . HCT 01/18/2019 45.2  39.0 - 52.0 % Final  . MCV 01/18/2019 92.8  80.0 - 100.0 fL Final  . MCH 01/18/2019 29.4  26.0 - 34.0 pg Final  . MCHC 01/18/2019 31.6  30.0 - 36.0 g/dL Final  . RDW 01/18/2019 14.3  11.5 - 15.5 % Final  . Platelets 01/18/2019 140* 150 - 400 K/uL Final  . nRBC 01/18/2019 0.0  0.0 - 0.2 % Final   Performed at [facility], [address] [place], [place] [address]  . Neutrophils Relative % 01/18/2019 PENDING  % Incomplete  . Neutro Abs 01/18/2019 PENDING  1.7 - 7.7 K/uL Incomplete  . Band Neutrophils 01/18/2019 PENDING  % Incomplete  . Lymphocytes Relative 01/18/2019 PENDING  % Incomplete  . Lymphs Abs 01/18/2019 PENDING  0.7 - 4.0 K/uL Incomplete  . Monocytes Relative 01/18/2019 PENDING  % Incomplete  . Monocytes Absolute 01/18/2019 PENDING  0.1 - 1.0 K/uL Incomplete  . Eosinophils Relative 01/18/2019 PENDING  % Incomplete  . Eosinophils  Absolute 01/18/2019 PENDING  0.0 - 0.5 K/uL Incomplete  . Basophils Relative 01/18/2019 PENDING  % Incomplete  . Basophils Absolute 01/18/2019 PENDING  0.0 - 0.1  K/uL Incomplete  . WBC Morphology 01/18/2019 PENDING   Incomplete  . RBC Morphology 01/18/2019 PENDING   Incomplete  . Smear Review 01/18/2019 PENDING   Incomplete  . Other 01/18/2019 PENDING  % Incomplete  . nRBC 01/18/2019 PENDING  0 /100 WBC Incomplete  . Metamyelocytes Relative 01/18/2019 PENDING  % Incomplete  . Myelocytes 01/18/2019 PENDING  % Incomplete  . Promyelocytes Relative 01/18/2019 PENDING  % Incomplete  . Blasts 01/18/2019 PENDING  % Incomplete  . Immature Granulocytes 01/18/2019 PENDING  % Incomplete  . Abs Immature Granulocytes 01/18/2019 PENDING  0.00 - 0.07 K/uL Incomplete    Assessment:  SHERLEY VIVO is a 85 y.o. male with stage 0 CLL.  He was diagnosed in 2010-2012 while living in [place], [place].  WBC has ranged between 17,000 - 22,000.  Flow cytometry on 01/12/2017 revealed a CD5 positive, CD23 positive clonal B-cell population with dim lambda light chain restriction, representing greater than 99% of the B cells and 70% of the leukocytes. Phenotype was typical of CLL/SLL. CD38 was expressed on a proximally 76% of clonal B cells.   There were no circulating blasts.  Symptomatically, he is doing well.  He denies any fevers, sweats or unintentional weight loss.  Exam reveals multiple soft nodules in his breasts bilaterally (? lipomas/cysts).  Plan: 1.   Labs today:  CBC with diff. 2.   Chronic lymphocytic leukemia (CLL) Hematocrit 45.2.  Hemoglobin 14.3.  WBC 14,800 (ANC 4600, ALC 9300). Clinically, he is doing well. Exam reveals no adenopathy or hepatosplenomegaly. Continue surveillance. 3.   Bilateral breast masses  Exam reveals numerous soft masses of varying sizes in both breasts.  Patient notes h/o lesions for 20 years, but patient remains concerned.  Bilateral mammogram and ultrasound. 4.    RTC after imaging for MD assess ([place]). 5.   RTC in 1 year for MD assessment and labs (CBC with difff).  I discussed the assessment and treatment plan with the patient.  The patient was provided an opportunity to ask questions and all were answered.  The patient agreed with the plan and demonstrated an understanding of the instructions.  The patient was advised to call back if the symptoms worsen or if the condition fails to improve as anticipated.  I provided 17 minutes (10:28 AM - 10:45 AM) of face-to-face time during this this encounter and > 50% was spent counseling as documented under my assessment and plan.    Lequita Asal, MD, PhD    01/18/2019, 10:28 AM  I, Samul Dada, am acting as a scribe for Lequita Asal, MD.  I, [place] Mike Gip, MD, have reviewed the above documentation for accuracy and completeness, and I agree with the above.

## 2019-01-18 ENCOUNTER — Other Ambulatory Visit: Payer: Medicare HMO

## 2019-01-18 ENCOUNTER — Inpatient Hospital Stay: Payer: Medicare HMO | Attending: Hematology and Oncology | Admitting: Hematology and Oncology

## 2019-01-18 ENCOUNTER — Encounter: Payer: Self-pay | Admitting: Hematology and Oncology

## 2019-01-18 ENCOUNTER — Inpatient Hospital Stay: Payer: Medicare HMO | Attending: Hematology and Oncology

## 2019-01-18 ENCOUNTER — Other Ambulatory Visit: Payer: Self-pay | Admitting: Hematology and Oncology

## 2019-01-18 ENCOUNTER — Ambulatory Visit: Payer: Medicare HMO | Admitting: Hematology and Oncology

## 2019-01-18 VITALS — BP 136/69 | HR 68 | Temp 96.8°F | Resp 18 | Ht 70.0 in | Wt 216.4 lb

## 2019-01-18 DIAGNOSIS — N6320 Unspecified lump in the left breast, unspecified quadrant: Secondary | ICD-10-CM | POA: Diagnosis not present

## 2019-01-18 DIAGNOSIS — N6310 Unspecified lump in the right breast, unspecified quadrant: Secondary | ICD-10-CM | POA: Diagnosis not present

## 2019-01-18 DIAGNOSIS — E039 Hypothyroidism, unspecified: Secondary | ICD-10-CM | POA: Diagnosis not present

## 2019-01-18 DIAGNOSIS — Z8052 Family history of malignant neoplasm of bladder: Secondary | ICD-10-CM | POA: Diagnosis not present

## 2019-01-18 DIAGNOSIS — Z7982 Long term (current) use of aspirin: Secondary | ICD-10-CM | POA: Insufficient documentation

## 2019-01-18 DIAGNOSIS — I252 Old myocardial infarction: Secondary | ICD-10-CM | POA: Insufficient documentation

## 2019-01-18 DIAGNOSIS — C9110 Chronic lymphocytic leukemia of B-cell type not having achieved remission: Secondary | ICD-10-CM | POA: Diagnosis not present

## 2019-01-18 DIAGNOSIS — E785 Hyperlipidemia, unspecified: Secondary | ICD-10-CM | POA: Diagnosis not present

## 2019-01-18 DIAGNOSIS — D171 Benign lipomatous neoplasm of skin and subcutaneous tissue of trunk: Secondary | ICD-10-CM | POA: Diagnosis not present

## 2019-01-18 DIAGNOSIS — Z79899 Other long term (current) drug therapy: Secondary | ICD-10-CM | POA: Insufficient documentation

## 2019-01-18 DIAGNOSIS — Z87891 Personal history of nicotine dependence: Secondary | ICD-10-CM | POA: Insufficient documentation

## 2019-01-18 DIAGNOSIS — N63 Unspecified lump in unspecified breast: Secondary | ICD-10-CM | POA: Diagnosis not present

## 2019-01-18 DIAGNOSIS — I2510 Atherosclerotic heart disease of native coronary artery without angina pectoris: Secondary | ICD-10-CM | POA: Insufficient documentation

## 2019-01-18 LAB — CBC WITH DIFFERENTIAL/PLATELET
Abs Immature Granulocytes: 0.02 10*3/uL (ref 0.00–0.07)
Basophils Absolute: 0.1 10*3/uL (ref 0.0–0.1)
Basophils Relative: 0 %
Eosinophils Absolute: 0.1 10*3/uL (ref 0.0–0.5)
Eosinophils Relative: 1 %
HCT: 45.2 % (ref 39.0–52.0)
Hemoglobin: 14.3 g/dL (ref 13.0–17.0)
Immature Granulocytes: 0 %
Lymphocytes Relative: 63 %
Lymphs Abs: 9.3 10*3/uL — ABNORMAL HIGH (ref 0.7–4.0)
MCH: 29.4 pg (ref 26.0–34.0)
MCHC: 31.6 g/dL (ref 30.0–36.0)
MCV: 92.8 fL (ref 80.0–100.0)
Monocytes Absolute: 0.7 10*3/uL (ref 0.1–1.0)
Monocytes Relative: 5 %
Neutro Abs: 4.6 10*3/uL (ref 1.7–7.7)
Neutrophils Relative %: 31 %
Platelets: 140 10*3/uL — ABNORMAL LOW (ref 150–400)
RBC: 4.87 MIL/uL (ref 4.22–5.81)
RDW: 14.3 % (ref 11.5–15.5)
WBC: 14.8 10*3/uL — ABNORMAL HIGH (ref 4.0–10.5)
nRBC: 0 % (ref 0.0–0.2)

## 2019-01-24 ENCOUNTER — Ambulatory Visit
Admission: RE | Admit: 2019-01-24 | Discharge: 2019-01-24 | Disposition: A | Discharge status: Home / Self Care | Payer: Medicare HMO | Source: Ambulatory Visit | Attending: Hematology and Oncology | Admitting: Hematology and Oncology

## 2019-01-24 DIAGNOSIS — N63 Unspecified lump in unspecified breast: Secondary | ICD-10-CM

## 2019-01-24 DIAGNOSIS — N6323 Unspecified lump in the left breast, lower outer quadrant: Secondary | ICD-10-CM | POA: Insufficient documentation

## 2019-01-24 DIAGNOSIS — N6312 Unspecified lump in the right breast, upper inner quadrant: Secondary | ICD-10-CM | POA: Diagnosis not present

## 2019-01-26 NOTE — Progress Notes (Signed)
[facility]  [address], [address] [place], [place] [address] Phone: [phone]  Fax: [phone]   Telemedicine Office Visit:  01/28/2019  Referring physician: Baxter Hire, MD  I connected with Karen Chafe Tassin on 01/28/19 at 11:00 AM via phone call and verified that I was speaking with the correct person using 2 identifiers.  The patient was at  home.  I discussed the limitations, risk, security and privacy concerns of performing an evaluation and management service by phone conference and the availability of in person appointments.  I also discussed with the patient that there may be a patient responsible charge related to this service.  The patient expressed understanding and agreed to proceed.   Chief Complaint: Connor Mays is a 85 y.o. male with chronic lymphocytic leukemia who is seen for assessment after interval imaging   HPI: The patient was last seen in the medical oncology clinic on 01/18/2019. At that time, he was doing well.  He denied any fevers, sweats or unintentional weight loss.  Exam revealed multiple soft nodules in his breasts bilaterally felt c/w lipomas.  Hematocrit was 45.2.  Hemoglobin 14.3.  WBC 14,800 (ANC 4600, ALC 9300).  Breast imaging was scheduled.  Bilateral mammogram and ltrasound on 01/24/2019 revealed multiple bilateral benign lipomas with no evidence of malignancy.  During the interim, he has been fine.  He notes nothing has changed since his last appointment.  Symptomatically, he is doing fine today.    Past Medical History:  Diagnosis Date  . Arthritis    lower back  . CAD (coronary artery disease)   . GERD (gastroesophageal reflux disease)   . Heart attack ([facility]) 1988  . Hyperlipidemia   . Hypothyroidism     Past Surgical History:  Procedure Laterality Date  . CATARACT EXTRACTION W/PHACO Right 08/13/2018   Procedure: CATARACT EXTRACTION PHACO AND INTRAOCULAR LENS PLACEMENT ([place]) RIGHT;  Surgeon: Eulogio Bear, MD;  Location: [facility];  Service: Ophthalmology;  Laterality: Right;  . CATARACT EXTRACTION W/PHACO Left 09/17/2018   Procedure: CATARACT EXTRACTION PHACO AND INTRAOCULAR LENS PLACEMENT ([place])  LEFT;  Surgeon: Eulogio Bear, MD;  Location: [facility];  Service: Ophthalmology;  Laterality: Left;  . CORONARY STENT PLACEMENT  2007  . heart bypass  1988  . HERNIA REPAIR    . LEFT HEART CATH AND CORS/GRAFTS ANGIOGRAPHY N/A 05/02/2017   Procedure: LEFT HEART CATH AND CORS/GRAFTS ANGIOGRAPHY;  Surgeon: Yolonda Kida, MD;  Location: [facility];  Service: Cardiovascular;  Laterality: N/A;  . TONSILLECTOMY      Family History  Problem Relation Age of Onset  . Healthy Sister   . Bladder Cancer Brother   . Healthy Sister     Social History:  reports that he has quit smoking. His smoking use included cigars. He quit smokeless tobacco use about 50 years ago. He reports that he does not drink alcohol or use drugs. Patient is a former pipe and cigar smoker; quit 50 years ago. Patient infrequently drinks beer. Patient denies known exposure to radiation and toxins. 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 The patient is alone today.  Participants in the patient's visit and their role in the encounter included the patient, Eura Rueda, and [employer], CMA, today.  The intake visit was provided by Vito Berger, CMA.   Allergies:  Allergies  Allergen Reactions  . Morphine Other (See Comments)    Pre-syncope  Current Medications: Current Outpatient Medications  Medication Sig Dispense Refill  . Ascorbic Acid (VITAMIN C) 1000 MG tablet Take 1,000 mg by mouth daily.     Marland Kitchen aspirin EC 81 MG tablet Take 81 mg by mouth daily.    . cetirizine (ZYRTEC) 10 MG tablet Take 10 mg by mouth daily as needed for allergies.    . Cholecalciferol (VITAMIN D3) 1000 units CAPS Take 1,000 Units by mouth daily.     . clopidogrel  (PLAVIX) 75 MG tablet Take 75 mg by mouth daily.     . fexofenadine (ALLEGRA) 180 MG tablet Take 180 mg by mouth daily.     . furosemide (LASIX) 20 MG tablet Take 20 mg by mouth as needed.    . gabapentin (NEURONTIN) 300 MG capsule Take 300 mg by mouth daily.    Marland Kitchen levothyroxine (SYNTHROID, LEVOTHROID) 50 MCG tablet Take 50 mcg by mouth daily before breakfast.     . losartan (COZAAR) 100 MG tablet Take 100 mg by mouth daily.     . pantoprazole (PROTONIX) 40 MG tablet Take 40 mg by mouth as needed.     . potassium chloride (KLOR-CON) 10 MEQ tablet TAKE 1 TABLET BY MOUTH ONCE DAILY AS NEEDED    . simvastatin (ZOCOR) 40 MG tablet Take 40 mg by mouth daily.    . tamsulosin (FLOMAX) 0.4 MG CAPS capsule Take 0.4 mg by mouth daily after supper.    . traMADol (ULTRAM) 50 MG tablet Take by mouth every 6 (six) hours as needed.    Marland Kitchen acetaminophen (TYLENOL) 500 MG tablet Take 500 mg by mouth every 6 (six) hours as needed.     . Multiple Vitamins-Minerals (PRESERVISION AREDS PO) Take 1 tablet by mouth daily.     . Pramipexole Dihydrochloride 2.25 MG TB24 Take 1 tablet by mouth at bedtime.    Marland Kitchen rOPINIRole (REQUIP) 0.5 MG tablet Take 1 mg by mouth at bedtime.  3   No current facility-administered medications for this visit.     Review of Systems  Constitutional: Negative.  Negative for chills, diaphoresis, fever, malaise/fatigue and weight loss (intentional weight loss).       Feels "fine".  HENT: Negative.  Negative for congestion, ear pain, hearing loss, nosebleeds, sinus pain and sore throat.   Eyes: Negative.  Negative for blurred vision and double vision.       S/p cataract surgery.  Respiratory: Negative.  Negative for cough, hemoptysis, sputum production and shortness of breath.   Cardiovascular: Negative.  Negative for chest pain, palpitations, orthopnea, leg swelling (ankles) and PND.  Gastrointestinal: Negative.  Negative for abdominal pain, blood in stool, constipation, diarrhea, melena, nausea  and vomiting.  Genitourinary: Negative.  Negative for dysuria, frequency, hematuria and urgency.  Musculoskeletal: Positive for back pain (chronic lower back ache; pain 2 of 10). Negative for falls, joint pain and myalgias.  Skin: Negative.  Negative for itching and rash.  Neurological: Negative.  Negative for dizziness, tremors, sensory change, speech change, focal weakness, weakness and headaches.  Endo/Heme/Allergies: Does not bruise/bleed easily.       HYPOthyroidism on levothyroxine.  Psychiatric/Behavioral: Negative.  Negative for depression and memory loss. The patient is not nervous/anxious and does not have insomnia.   All other systems reviewed and are negative.  Performance status (ECOG): 1  Vitals There were no vitals taken for this visit.   Physical Exam  Nursing note reviewed.   No visits with results within 3 Day(s) from this visit.  Latest known  visit with results is:  Appointment on 01/18/2019  Component Date Value Ref Range Status  . WBC 01/18/2019 14.8* 4.0 - 10.5 K/uL Final  . RBC 01/18/2019 4.87  4.22 - 5.81 MIL/uL Final  . Hemoglobin 01/18/2019 14.3  13.0 - 17.0 g/dL Final  . HCT 01/18/2019 45.2  39.0 - 52.0 % Final  . MCV 01/18/2019 92.8  80.0 - 100.0 fL Final  . MCH 01/18/2019 29.4  26.0 - 34.0 pg Final  . MCHC 01/18/2019 31.6  30.0 - 36.0 g/dL Final  . RDW 01/18/2019 14.3  11.5 - 15.5 % Final  . Platelets 01/18/2019 140* 150 - 400 K/uL Final  . nRBC 01/18/2019 0.0  0.0 - 0.2 % Final  . Neutrophils Relative % 01/18/2019 31  % Final  . Neutro Abs 01/18/2019 4.6  1.7 - 7.7 K/uL Final  . Lymphocytes Relative 01/18/2019 63  % Final  . Lymphs Abs 01/18/2019 9.3* 0.7 - 4.0 K/uL Final  . Monocytes Relative 01/18/2019 5  % Final  . Monocytes Absolute 01/18/2019 0.7  0.1 - 1.0 K/uL Final  . Eosinophils Relative 01/18/2019 1  % Final  . Eosinophils Absolute 01/18/2019 0.1  0.0 - 0.5 K/uL Final  . Basophils Relative 01/18/2019 0  % Final  . Basophils Absolute  01/18/2019 0.1  0.0 - 0.1 K/uL Final  . Immature Granulocytes 01/18/2019 0  % Final  . Abs Immature Granulocytes 01/18/2019 0.02  0.00 - 0.07 K/uL Final   Performed at [facility], [address] [place], [place] [address]    Assessment:  TRESTEN ELWOOD is a 85 y.o. male with stage 0 CLL. He was diagnosed in 2010-2012 while living in [place], [place]. WBC has ranged between 17,000 - 22,000.  Flow cytometryon 01/12/2017 revealed a CD5 positive, CD23 positive clonal B-cell population with dim lambda light chain restriction, representing greater than 99% of the B cells and 70% of the leukocytes. Phenotype was typical of CLL/SLL. CD38 was expressed on a proximally 76% of clonal B cells. There were no circulating blasts.  Bilateral mammogram and ultrasound on 01/24/2019 revealed multiple bilateral benign lipomas with no evidence of malignancy.  Symptomatically, he is doing well.  Prior exam revealed multiple bilateral breast soft nodules.  He has no adenopathy or hepatosplenomegaly.  Plan: 1.   Chronic lymphocytic leukemia (CLL) Hematocrit 45.2.  Hemoglobin 14.3.  WBC 14,800 ([place] 4600, ALC 9300) on 01/18/2019. He continues to do well. Continue surveillance. 2.   Bilateral breast masses             Exam revealed numerous soft masses of varying sizes in both breasts.             Bilateral mammogram and ultrasound confirmed multiple benign lipomas.   Send patient copy of imaging             Reassurance provided. 3.   RTC on 01/20/2020 for MD assessment and labs.  I discussed the assessment and treatment plan with the patient.  The patient was provided an opportunity to ask questions and all were answered.  The patient agreed with the plan and demonstrated an understanding of the instructions.  The patient was advised to call back if the symptoms worsen or if the condition fails to improve as anticipated.  I provided 9 minutes (10:51 AM - 11:00 AM) of non face-to-face time  during this this encounter and > 50% was spent counseling as documented under my assessment and plan.  Lequita Asal, MD, PhD    01/28/2019, 11:00 AM  I, Samul Dada, am acting as a scribe for Lequita Asal, MD.  I, [place] Mike Gip, MD, have reviewed the above documentation for accuracy and completeness, and I agree with the above.

## 2019-01-28 ENCOUNTER — Encounter: Payer: Self-pay | Admitting: Hematology and Oncology

## 2019-01-28 ENCOUNTER — Inpatient Hospital Stay (HOSPITAL_BASED_OUTPATIENT_CLINIC_OR_DEPARTMENT_OTHER): Payer: Medicare HMO | Admitting: Hematology and Oncology

## 2019-01-28 DIAGNOSIS — Z7982 Long term (current) use of aspirin: Secondary | ICD-10-CM

## 2019-01-28 DIAGNOSIS — E785 Hyperlipidemia, unspecified: Secondary | ICD-10-CM | POA: Diagnosis not present

## 2019-01-28 DIAGNOSIS — C9110 Chronic lymphocytic leukemia of B-cell type not having achieved remission: Secondary | ICD-10-CM

## 2019-01-28 DIAGNOSIS — N63 Unspecified lump in unspecified breast: Secondary | ICD-10-CM

## 2019-01-28 DIAGNOSIS — Z79899 Other long term (current) drug therapy: Secondary | ICD-10-CM

## 2019-01-28 NOTE — Progress Notes (Signed)
No new changes noted today. The patient name and DOB has been verified by phone today. 

## 2019-11-12 ENCOUNTER — Ambulatory Visit
Admission: RE | Admit: 2019-11-12 | Discharge: 2019-11-12 | Disposition: A | Discharge status: Home / Self Care | Payer: Medicare HMO | Source: Ambulatory Visit | Attending: Family Medicine | Admitting: Family Medicine

## 2019-11-12 ENCOUNTER — Other Ambulatory Visit: Payer: Self-pay | Admitting: Family Medicine

## 2019-11-12 ENCOUNTER — Other Ambulatory Visit: Payer: Self-pay

## 2019-11-12 DIAGNOSIS — M7989 Other specified soft tissue disorders: Secondary | ICD-10-CM

## 2019-11-12 DIAGNOSIS — M25561 Pain in right knee: Secondary | ICD-10-CM | POA: Diagnosis present

## 2020-01-16 NOTE — Progress Notes (Signed)
[facility]  [address], [address] [place], [place] [address] Phone: [phone]  Fax: [phone]   Clinic Day:  01/20/2020  Referring physician: Baxter Hire, MD  Chief Complaint: Connor Mays is a 86 y.o. male with chronic lymphocytic leukemia who is seen for 1 year assessment.  HPI: The patient was last seen in the medical oncology clinic on 01/28/2019 via telemedicine. At that time, he was doing well.  Prior exam revealed multiple bilateral breast soft nodules (benign lipomas).  He had no adenopathy or hepatosplenomegaly. Hematocrit was 45.2, hemoglobin 14.3, platelets 140,000, WBC 14,800 (ANC 4,600). We discussed continued surveillance.   The patient saw Dr. Sherilyn Cooter on 11/12/2019 for posterior right knee pain after a fall. 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 Bilateral lower extremity venous duplex revealed a right lower extremity DVT localized to an anterior right peroneal vein in the proximal calf. There was no evidence of left lower extremity DVT.  He was started on Eliquis.  During the interim, he has been "fine." He denies fever, sweats, weight loss, infections, bone pain, or bruising or bleeding. He is taking Eliquis for another 2 months.   Past Medical History:  Diagnosis Date  . Arthritis    lower back  . CAD (coronary artery disease)   . GERD (gastroesophageal reflux disease)   . Heart attack ([place]) 1988  . Hyperlipidemia   . Hypothyroidism     Past Surgical History:  Procedure Laterality Date  . CATARACT EXTRACTION W/PHACO Right 08/13/2018   Procedure: CATARACT EXTRACTION PHACO AND INTRAOCULAR LENS PLACEMENT ([place]) RIGHT;  Surgeon: Eulogio Bear, MD;  Location: [facility];  Service: Ophthalmology;  Laterality:  Right;  . CATARACT EXTRACTION W/PHACO Left 09/17/2018   Procedure: CATARACT EXTRACTION PHACO AND INTRAOCULAR LENS PLACEMENT ([place])  LEFT;  Surgeon: Eulogio Bear, MD;  Location: [facility];  Service: Ophthalmology;  Laterality: Left;  . CORONARY STENT PLACEMENT  2007  . heart bypass  1988  . HERNIA REPAIR    . LEFT HEART CATH AND CORS/GRAFTS ANGIOGRAPHY N/A 05/02/2017   Procedure: LEFT HEART CATH AND CORS/GRAFTS ANGIOGRAPHY;  Surgeon: Yolonda Kida, MD;  Location: [facility];  Service: Cardiovascular;  Laterality: N/A;  . TONSILLECTOMY      Family History  Problem Relation Age of Onset  . Healthy Sister   . Bladder Cancer Brother   . Healthy Sister     Social History:  reports that he has quit smoking. His smoking use included cigars. He quit smokeless tobacco use about 51 years ago. He reports that he does not drink alcohol and does not use drugs. Patient is a former pipe and cigar smoker; quit 50 years ago. Patient infrequently drinks beer. Patient denies known exposure to radiation and toxins. Patient is a retired Conservation officer, nature; worked for [employer].  He lives in [place]. The patient is alone today.   Allergies:  Allergies  Allergen Reactions  . Morphine Other (See Comments)    Pre-syncope    Current Medications: Current Outpatient Medications  Medication Sig Dispense Refill  . acetaminophen (TYLENOL) 500 MG tablet Take 500 mg by mouth every 6 (six) hours as needed.     . Ascorbic Acid (VITAMIN C) 1000 MG tablet Take 1,000 mg by mouth daily.     Marland Kitchen  aspirin EC 81 MG tablet Take 81 mg by mouth daily.    . cetirizine (ZYRTEC) 10 MG tablet Take 10 mg by mouth daily as needed for allergies.    . Cholecalciferol (VITAMIN D3) 1000 units CAPS Take 1,000 Units by mouth daily.     . clopidogrel (PLAVIX) 75 MG tablet Take 75 mg by mouth daily.     . fexofenadine (ALLEGRA) 180 MG tablet Take 180 mg by mouth daily.     . furosemide (LASIX) 20 MG tablet Take 20 mg  by mouth as needed.    . gabapentin (NEURONTIN) 300 MG capsule Take 300 mg by mouth daily.    Marland Kitchen levothyroxine (SYNTHROID, LEVOTHROID) 50 MCG tablet Take 50 mcg by mouth daily before breakfast.     . losartan (COZAAR) 100 MG tablet Take 100 mg by mouth daily.     . Multiple Vitamins-Minerals (PRESERVISION AREDS PO) Take 1 tablet by mouth daily.     . simvastatin (ZOCOR) 40 MG tablet Take 40 mg by mouth daily.    . tamsulosin (FLOMAX) 0.4 MG CAPS capsule Take 0.4 mg by mouth daily after supper.    . traMADol (ULTRAM) 50 MG tablet Take by mouth every 6 (six) hours as needed.     No current facility-administered medications for this visit.    Review of Systems  Constitutional: Negative.  Negative for chills, diaphoresis, fever, malaise/fatigue and weight loss (up 1 lb).       Feels "fine".  HENT: Negative.  Negative for congestion, ear discharge, ear pain, hearing loss, nosebleeds, sinus pain, sore throat and tinnitus.   Eyes: Negative.  Negative for blurred vision and double vision.       S/p cataract surgery.  Respiratory: Negative.  Negative for cough, hemoptysis, sputum production and shortness of breath.   Cardiovascular: Negative.  Negative for chest pain, palpitations, orthopnea, leg swelling and PND.  Gastrointestinal: Negative.  Negative for abdominal pain, blood in stool, constipation, diarrhea, heartburn, melena, nausea and vomiting.  Genitourinary: Negative.  Negative for dysuria, frequency, hematuria and urgency.  Musculoskeletal: Positive for falls (10/2019). Negative for back pain, joint pain, myalgias and neck pain.  Skin: Negative.  Negative for itching and rash.  Neurological: Negative.  Negative for dizziness, tingling, sensory change, weakness and headaches.  Endo/Heme/Allergies: Does not bruise/bleed easily.       HYPOthyroidism on levothyroxine.  Psychiatric/Behavioral: Negative.  Negative for depression and memory loss. The patient is not nervous/anxious and does not  have insomnia.   All other systems reviewed and are negative.  Performance status (ECOG): 1  Vitals Blood pressure (!) 145/74, pulse 60, temperature (!) 97.2 F (36.2 C), temperature source Tympanic, resp. rate 20, weight 217 lb 2.5 oz (98.5 kg), SpO2 100 %.   Physical Exam Vitals and nursing note reviewed.  Constitutional:      General: He is not in acute distress.    Appearance: He is not diaphoretic.  HENT:     Head: Normocephalic and atraumatic.     Comments: Gray hair.    Mouth/Throat:     Mouth: Mucous membranes are moist.     Pharynx: Oropharynx is clear.  Eyes:     General: No scleral icterus.    Extraocular Movements: Extraocular movements intact.     Conjunctiva/sclera: Conjunctivae normal.     Pupils: Pupils are equal, round, and reactive to light.     Comments: Glasses.  Blue eyes.  Cardiovascular:     Rate and Rhythm: Normal rate and regular  rhythm.     Heart sounds: Normal heart sounds. No murmur heard.   Pulmonary:     Effort: Pulmonary effort is normal. No respiratory distress.     Breath sounds: Normal breath sounds. No wheezing or rales.  Chest:     Chest wall: No tenderness.  Abdominal:     General: Bowel sounds are normal. There is no distension.     Palpations: Abdomen is soft. There is no mass.     Tenderness: There is no abdominal tenderness. There is no guarding or rebound.  Musculoskeletal:        General: No swelling or tenderness. Normal range of motion.     Cervical back: Normal range of motion and neck supple.     Right lower leg: No edema.     Left lower leg: No edema.  Lymphadenopathy:     Head:     Right side of head: No preauricular, posterior auricular or occipital adenopathy.     Left side of head: No preauricular, posterior auricular or occipital adenopathy.     Cervical: No cervical adenopathy.     Upper Body:     Right upper body: No supraclavicular or axillary adenopathy.     Left upper body: No supraclavicular or axillary  adenopathy.     Lower Body: No right inguinal adenopathy. No left inguinal adenopathy.  Skin:    General: Skin is warm and dry.  Neurological:     Mental Status: He is alert and oriented to person, place, and time.  Psychiatric:        Behavior: Behavior normal.        Thought Content: Thought content normal.        Judgment: Judgment normal.    Appointment on 01/20/2020  Component Date Value Ref Range Status  . WBC 01/20/2020 14.9* 4.0 - 10.5 K/uL Final  . RBC 01/20/2020 4.70  4.22 - 5.81 MIL/uL Final  . Hemoglobin 01/20/2020 13.8  13.0 - 17.0 g/dL Final  . HCT 01/20/2020 43.7  39 - 52 % Final  . MCV 01/20/2020 93.0  80.0 - 100.0 fL Final  . MCH 01/20/2020 29.4  26.0 - 34.0 pg Final  . MCHC 01/20/2020 31.6  30.0 - 36.0 g/dL Final  . RDW 01/20/2020 14.4  11.5 - 15.5 % Final  . Platelets 01/20/2020 163  150 - 400 K/uL Final  . nRBC 01/20/2020 0.0  0.0 - 0.2 % Final   Performed at [facility], [address] [place], [place] [address]  . Neutrophils Relative % 01/20/2020 PENDING  % Incomplete  . Neutro Abs 01/20/2020 PENDING  1.7 - 7.7 K/uL Incomplete  . Band Neutrophils 01/20/2020 PENDING  % Incomplete  . Lymphocytes Relative 01/20/2020 PENDING  % Incomplete  . Lymphs Abs 01/20/2020 PENDING  0.7 - 4.0 K/uL Incomplete  . Monocytes Relative 01/20/2020 PENDING  % Incomplete  . Monocytes Absolute 01/20/2020 PENDING  0.1 - 1.0 K/uL Incomplete  . Eosinophils Relative 01/20/2020 PENDING  % Incomplete  . Eosinophils Absolute 01/20/2020 PENDING  0.0 - 0.5 K/uL Incomplete  . Basophils Relative 01/20/2020 PENDING  % Incomplete  . Basophils Absolute 01/20/2020 PENDING  0.0 - 0.1 K/uL Incomplete  . WBC Morphology 01/20/2020 PENDING   Incomplete  . RBC Morphology 01/20/2020 PENDING   Incomplete  . Smear Review 01/20/2020 PENDING   Incomplete  . Other 01/20/2020 PENDING  % Incomplete  . nRBC 01/20/2020 PENDING  0 /100 WBC Incomplete  . Metamyelocytes Relative 01/20/2020  PENDING  %  Incomplete  . Myelocytes 01/20/2020 PENDING  % Incomplete  . Promyelocytes Relative 01/20/2020 PENDING  % Incomplete  . Blasts 01/20/2020 PENDING  % Incomplete  . Immature Granulocytes 01/20/2020 PENDING  % Incomplete  . Abs Immature Granulocytes 01/20/2020 PENDING  0.00 - 0.07 K/uL Incomplete    Assessment:  Connor Mays is a 86 y.o. male with stage 0 CLL. He was diagnosed in 2010-2012 while living in [place], [place]. WBC has ranged between 17,000 - 22,000.  Flow cytometryon 01/12/2017 revealed a CD5 positive, CD23 positive clonal B-cell population with dim lambda light chain restriction, representing greater than 99% of the B cells and 70% of the leukocytes. Phenotype was typical of CLL/SLL. CD38 was expressed on a proximally 76% of clonal B cells. There were no circulating blasts.  Bilateral mammogram and ultrasound on 01/24/2019 revealed multiple bilateral benign lipomas with no evidence of malignancy.  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 Bilateral lower extremity venous duplex on 11/12/2019 revealed a right lower extremity DVT localized to an anterior right peroneal vein in the proximal calf. There was no evidence of left lower extremity DVT.  He was started on Eliquis.  Symptomatically, he feels "fine".  He denies any excess bruising or bleeding.  Exam reveals no adenopathy or hepatosplenomegaly.  Plan: 1.   Labs today:  CBC with diff, CMP, Factor V Leiden, prothrombin gene mutation, lupus anticoagulant, anti-cardiolipin antibodies, beta2-glycoprotein antibodies. 2.   Chronic lymphocytic leukemia (CLL) Hematocrit 43.7.  Hemoglobin 10.8.  WBC  14,900 (ANC  4400, ALC  8600) today. He denies any fevers, sweats or weight loss. Exam  reveals no adenopathy or hepatosplenomegaly. Continue to monitor. 3.   Right lower extremity DVT  Patient has had an interval right lower extremity DVT.  Discuss plans for hypercoagulable work-up.  He is on Eliquis. 4.   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  Assist with referral. 5.   RN to call patient with hypercoagulable work-up results. 6.   If lupus anticoagulant + then repeat testing 2 days after planned discontinuation of Eliquis (coordinate with patient). 7.   RTC in 1 year for MD assessment and labs (CBC with diff, CMP, LDH).  I discussed the assessment and treatment plan with the patient.  The patient was provided an opportunity to ask questions and all were answered.  The patient agreed with the plan and demonstrated an understanding of the instructions.  The patient was advised to call back if the symptoms worsen or if the condition fails to improve as anticipated.   Lequita Asal, MD, PhD    01/20/2020, 11:16 AM  I, Evert Kohl, am acting as a Education administrator for Lequita Asal, MD.  I, [place] Mike Gip, MD, have reviewed the above documentation for accuracy and completeness, and I agree with the above.

## 2020-01-20 ENCOUNTER — Other Ambulatory Visit: Payer: Self-pay

## 2020-01-20 ENCOUNTER — Ambulatory Visit: Payer: Medicare HMO | Admitting: Hematology and Oncology

## 2020-01-20 ENCOUNTER — Other Ambulatory Visit: Payer: Medicare HMO

## 2020-01-20 ENCOUNTER — Inpatient Hospital Stay: Payer: Medicare HMO | Attending: Hematology and Oncology

## 2020-01-20 ENCOUNTER — Encounter: Payer: Self-pay | Admitting: Hematology and Oncology

## 2020-01-20 ENCOUNTER — Inpatient Hospital Stay (HOSPITAL_BASED_OUTPATIENT_CLINIC_OR_DEPARTMENT_OTHER): Payer: Medicare HMO | Admitting: Hematology and Oncology

## 2020-01-20 VITALS — BP 145/74 | HR 60 | Temp 97.2°F | Resp 20 | Wt 217.2 lb

## 2020-01-20 DIAGNOSIS — E039 Hypothyroidism, unspecified: Secondary | ICD-10-CM | POA: Diagnosis not present

## 2020-01-20 DIAGNOSIS — N6320 Unspecified lump in the left breast, unspecified quadrant: Secondary | ICD-10-CM | POA: Insufficient documentation

## 2020-01-20 DIAGNOSIS — Z79899 Other long term (current) drug therapy: Secondary | ICD-10-CM | POA: Insufficient documentation

## 2020-01-20 DIAGNOSIS — N6310 Unspecified lump in the right breast, unspecified quadrant: Secondary | ICD-10-CM | POA: Insufficient documentation

## 2020-01-20 DIAGNOSIS — C9110 Chronic lymphocytic leukemia of B-cell type not having achieved remission: Secondary | ICD-10-CM | POA: Insufficient documentation

## 2020-01-20 DIAGNOSIS — D492 Neoplasm of unspecified behavior of bone, soft tissue, and skin: Secondary | ICD-10-CM | POA: Insufficient documentation

## 2020-01-20 DIAGNOSIS — Z7982 Long term (current) use of aspirin: Secondary | ICD-10-CM | POA: Diagnosis not present

## 2020-01-20 DIAGNOSIS — I252 Old myocardial infarction: Secondary | ICD-10-CM | POA: Diagnosis not present

## 2020-01-20 DIAGNOSIS — Z87891 Personal history of nicotine dependence: Secondary | ICD-10-CM | POA: Insufficient documentation

## 2020-01-20 DIAGNOSIS — Z8052 Family history of malignant neoplasm of bladder: Secondary | ICD-10-CM | POA: Diagnosis not present

## 2020-01-20 DIAGNOSIS — I82451 Acute embolism and thrombosis of right peroneal vein: Secondary | ICD-10-CM

## 2020-01-20 DIAGNOSIS — I2510 Atherosclerotic heart disease of native coronary artery without angina pectoris: Secondary | ICD-10-CM | POA: Diagnosis not present

## 2020-01-20 DIAGNOSIS — E785 Hyperlipidemia, unspecified: Secondary | ICD-10-CM | POA: Diagnosis not present

## 2020-01-20 LAB — CBC WITH DIFFERENTIAL/PLATELET
Abs Immature Granulocytes: 0.04 10*3/uL (ref 0.00–0.07)
Basophils Absolute: 0 10*3/uL (ref 0.0–0.1)
Basophils Relative: 0 %
Eosinophils Absolute: 0.1 10*3/uL (ref 0.0–0.5)
Eosinophils Relative: 1 %
HCT: 43.7 % (ref 39.0–52.0)
Hemoglobin: 13.8 g/dL (ref 13.0–17.0)
Immature Granulocytes: 0 %
Lymphocytes Relative: 65 %
Lymphs Abs: 9.6 10*3/uL — ABNORMAL HIGH (ref 0.7–4.0)
MCH: 29.4 pg (ref 26.0–34.0)
MCHC: 31.6 g/dL (ref 30.0–36.0)
MCV: 93 fL (ref 80.0–100.0)
Monocytes Absolute: 0.6 10*3/uL (ref 0.1–1.0)
Monocytes Relative: 4 %
Neutro Abs: 4.4 10*3/uL (ref 1.7–7.7)
Neutrophils Relative %: 30 %
Platelets: 163 10*3/uL (ref 150–400)
RBC: 4.7 MIL/uL (ref 4.22–5.81)
RDW: 14.4 % (ref 11.5–15.5)
WBC: 14.9 10*3/uL — ABNORMAL HIGH (ref 4.0–10.5)
nRBC: 0 % (ref 0.0–0.2)

## 2020-01-20 NOTE — Progress Notes (Signed)
Patient denies any pain or concerns at this time. Does report falling 4 months ago due to a blood clot.

## 2020-01-22 ENCOUNTER — Encounter: Payer: Self-pay | Admitting: Hematology and Oncology

## 2020-01-23 ENCOUNTER — Encounter: Payer: Self-pay | Admitting: Hematology and Oncology

## 2020-02-03 ENCOUNTER — Telehealth: Payer: Self-pay

## 2020-02-03 NOTE — Telephone Encounter (Signed)
Referral sent to Dr. Tyler Pita, neurosurgeon at [facility]. 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

## 2020-02-04 ENCOUNTER — Other Ambulatory Visit: Payer: Self-pay

## 2020-02-04 ENCOUNTER — Inpatient Hospital Stay: Payer: Medicare HMO

## 2020-02-04 DIAGNOSIS — C9110 Chronic lymphocytic leukemia of B-cell type not having achieved remission: Secondary | ICD-10-CM

## 2020-02-04 DIAGNOSIS — I82451 Acute embolism and thrombosis of right peroneal vein: Secondary | ICD-10-CM

## 2020-02-04 LAB — CBC WITH DIFFERENTIAL/PLATELET
Abs Immature Granulocytes: 0.02 10*3/uL (ref 0.00–0.07)
Basophils Absolute: 0 10*3/uL (ref 0.0–0.1)
Basophils Relative: 0 %
Eosinophils Absolute: 0.2 10*3/uL (ref 0.0–0.5)
Eosinophils Relative: 1 %
HCT: 43.9 % (ref 39.0–52.0)
Hemoglobin: 14 g/dL (ref 13.0–17.0)
Immature Granulocytes: 0 %
Lymphocytes Relative: 67 %
Lymphs Abs: 8.6 10*3/uL — ABNORMAL HIGH (ref 0.7–4.0)
MCH: 29.5 pg (ref 26.0–34.0)
MCHC: 31.9 g/dL (ref 30.0–36.0)
MCV: 92.4 fL (ref 80.0–100.0)
Monocytes Absolute: 0.5 10*3/uL (ref 0.1–1.0)
Monocytes Relative: 4 %
Neutro Abs: 3.6 10*3/uL (ref 1.7–7.7)
Neutrophils Relative %: 28 %
Platelets: 133 10*3/uL — ABNORMAL LOW (ref 150–400)
RBC: 4.75 MIL/uL (ref 4.22–5.81)
RDW: 14.5 % (ref 11.5–15.5)
Smear Review: NORMAL
WBC: 12.9 10*3/uL — ABNORMAL HIGH (ref 4.0–10.5)
nRBC: 0 % (ref 0.0–0.2)

## 2020-02-04 LAB — COMPREHENSIVE METABOLIC PANEL
ALT: 6 U/L (ref 0–44)
AST: 23 U/L (ref 15–41)
Albumin: 4.1 g/dL (ref 3.5–5.0)
Alkaline Phosphatase: 54 U/L (ref 38–126)
Anion gap: 10 (ref 5–15)
BUN: 23 mg/dL (ref 8–23)
CO2: 28 mmol/L (ref 22–32)
Calcium: 8.8 mg/dL — ABNORMAL LOW (ref 8.9–10.3)
Chloride: 105 mmol/L (ref 98–111)
Creatinine, Ser: 1.01 mg/dL (ref 0.61–1.24)
GFR, Estimated: >60 mL/min (ref >60)
Glucose, Bld: 100 mg/dL — ABNORMAL HIGH (ref 70–99)
Potassium: 3.8 mmol/L (ref 3.5–5.1)
Sodium: 143 mmol/L (ref 135–145)
Total Bilirubin: 0.9 mg/dL (ref 0.3–1.2)
Total Protein: 7.1 g/dL (ref 6.5–8.1)

## 2020-02-04 LAB — LACTATE DEHYDROGENASE: LDH: 159 U/L (ref 98–192)

## 2020-02-05 LAB — CARDIOLIPIN ANTIBODIES, IGG, IGM, IGA
Anticardiolipin IgA: <9 APL U/mL (ref 0–11)
Anticardiolipin IgG: <9 GPL U/mL (ref 0–14)
Anticardiolipin IgM: <9 MPL U/mL (ref 0–12)

## 2020-02-05 LAB — LUPUS ANTICOAGULANT PANEL
DRVVT: 43.1 s (ref 0.0–47.0)
PTT Lupus Anticoagulant: 32.6 s (ref 0.0–51.9)

## 2020-02-06 LAB — BETA-2-GLYCOPROTEIN I ABS, IGG/M/A
Beta-2 Glyco I IgG: <9 GPI IgG units (ref 0–20)
Beta-2-Glycoprotein I IgA: <9 GPI IgA units (ref 0–25)
Beta-2-Glycoprotein I IgM: <9 GPI IgM units (ref 0–32)

## 2020-02-07 LAB — FACTOR 5 LEIDEN

## 2020-02-10 ENCOUNTER — Telehealth: Payer: Self-pay

## 2020-02-10 LAB — PROTHROMBIN GENE MUTATION

## 2020-02-10 NOTE — Telephone Encounter (Signed)
Connor Mays has been scheduled to see Dr. Tamala Julian on 02/25/20. Dr. Mike Gip notified of date. Spoke with radiology and requested that 11/12/19 US soft tissue lower extremity, right, be shared with [facility] radiology.

## 2020-03-18 ENCOUNTER — Other Ambulatory Visit: Payer: Self-pay | Admitting: Neurosurgery

## 2020-03-18 ENCOUNTER — Other Ambulatory Visit (HOSPITAL_COMMUNITY): Payer: Self-pay | Admitting: Neurosurgery

## 2020-03-18 ENCOUNTER — Other Ambulatory Visit: Payer: Self-pay | Admitting: Student

## 2020-03-18 DIAGNOSIS — D492 Neoplasm of unspecified behavior of bone, soft tissue, and skin: Secondary | ICD-10-CM

## 2020-03-19 ENCOUNTER — Other Ambulatory Visit (HOSPITAL_COMMUNITY): Payer: Self-pay | Admitting: Physical Medicine and Rehabilitation

## 2020-03-19 ENCOUNTER — Other Ambulatory Visit: Payer: Self-pay | Admitting: Physical Medicine and Rehabilitation

## 2020-03-19 DIAGNOSIS — M5416 Radiculopathy, lumbar region: Secondary | ICD-10-CM

## 2020-03-26 ENCOUNTER — Ambulatory Visit: Payer: Medicare HMO

## 2020-03-27 ENCOUNTER — Other Ambulatory Visit: Payer: Self-pay

## 2020-03-27 ENCOUNTER — Ambulatory Visit
Admission: RE | Admit: 2020-03-27 | Discharge: 2020-03-27 | Disposition: A | Discharge status: Home / Self Care | Payer: Medicare HMO | Source: Ambulatory Visit | Attending: *Deleted | Admitting: *Deleted

## 2020-03-27 ENCOUNTER — Ambulatory Visit
Admission: RE | Admit: 2020-03-27 | Discharge: 2020-03-27 | Disposition: A | Discharge status: Home / Self Care | Payer: Medicare HMO | Source: Ambulatory Visit | Attending: Physical Medicine and Rehabilitation | Admitting: Physical Medicine and Rehabilitation

## 2020-03-27 DIAGNOSIS — D492 Neoplasm of unspecified behavior of bone, soft tissue, and skin: Secondary | ICD-10-CM | POA: Diagnosis not present

## 2020-03-27 DIAGNOSIS — M5416 Radiculopathy, lumbar region: Secondary | ICD-10-CM | POA: Diagnosis present

## 2020-03-27 MED ORDER — GADOBUTROL 1 MMOL/ML IV SOLN
9.0000 mL | Freq: Once | INTRAVENOUS | Status: AC | PRN
  Administered 2020-03-27: 9 mL via INTRAVENOUS

## 2021-01-17 ENCOUNTER — Other Ambulatory Visit: Payer: Self-pay | Admitting: *Deleted

## 2021-01-17 DIAGNOSIS — C9110 Chronic lymphocytic leukemia of B-cell type not having achieved remission: Secondary | ICD-10-CM

## 2021-01-18 ENCOUNTER — Ambulatory Visit: Payer: Medicare HMO | Admitting: Hematology and Oncology

## 2021-01-18 ENCOUNTER — Other Ambulatory Visit: Payer: Self-pay

## 2021-01-18 ENCOUNTER — Other Ambulatory Visit: Payer: Medicare HMO

## 2021-01-18 ENCOUNTER — Inpatient Hospital Stay: Payer: Medicare HMO | Attending: Oncology

## 2021-01-18 ENCOUNTER — Encounter: Payer: Self-pay | Admitting: Oncology

## 2021-01-18 ENCOUNTER — Inpatient Hospital Stay (HOSPITAL_BASED_OUTPATIENT_CLINIC_OR_DEPARTMENT_OTHER): Payer: Medicare HMO | Admitting: Oncology

## 2021-01-18 VITALS — BP 152/81 | HR 57 | Temp 97.3°F | Wt 218.9 lb

## 2021-01-18 DIAGNOSIS — E785 Hyperlipidemia, unspecified: Secondary | ICD-10-CM | POA: Diagnosis not present

## 2021-01-18 DIAGNOSIS — Z87891 Personal history of nicotine dependence: Secondary | ICD-10-CM | POA: Diagnosis not present

## 2021-01-18 DIAGNOSIS — Z79899 Other long term (current) drug therapy: Secondary | ICD-10-CM | POA: Insufficient documentation

## 2021-01-18 DIAGNOSIS — Z86718 Personal history of other venous thrombosis and embolism: Secondary | ICD-10-CM | POA: Insufficient documentation

## 2021-01-18 DIAGNOSIS — E039 Hypothyroidism, unspecified: Secondary | ICD-10-CM | POA: Diagnosis not present

## 2021-01-18 DIAGNOSIS — C9110 Chronic lymphocytic leukemia of B-cell type not having achieved remission: Secondary | ICD-10-CM | POA: Insufficient documentation

## 2021-01-18 DIAGNOSIS — I2510 Atherosclerotic heart disease of native coronary artery without angina pectoris: Secondary | ICD-10-CM | POA: Diagnosis not present

## 2021-01-18 DIAGNOSIS — Z7901 Long term (current) use of anticoagulants: Secondary | ICD-10-CM | POA: Insufficient documentation

## 2021-01-18 DIAGNOSIS — I252 Old myocardial infarction: Secondary | ICD-10-CM | POA: Insufficient documentation

## 2021-01-18 DIAGNOSIS — Z7982 Long term (current) use of aspirin: Secondary | ICD-10-CM | POA: Diagnosis not present

## 2021-01-18 LAB — CBC WITH DIFFERENTIAL/PLATELET
Abs Immature Granulocytes: 0.04 10*3/uL (ref 0.00–0.07)
Basophils Absolute: 0 10*3/uL (ref 0.0–0.1)
Basophils Relative: 0 %
Eosinophils Absolute: 0.2 10*3/uL (ref 0.0–0.5)
Eosinophils Relative: 1 %
HCT: 41.8 % (ref 39.0–52.0)
Hemoglobin: 13.3 g/dL (ref 13.0–17.0)
Immature Granulocytes: 0 %
Lymphocytes Relative: 65 %
Lymphs Abs: 8 10*3/uL — ABNORMAL HIGH (ref 0.7–4.0)
MCH: 29.6 pg (ref 26.0–34.0)
MCHC: 31.8 g/dL (ref 30.0–36.0)
MCV: 92.9 fL (ref 80.0–100.0)
Monocytes Absolute: 0.5 10*3/uL (ref 0.1–1.0)
Monocytes Relative: 4 %
Neutro Abs: 3.7 10*3/uL (ref 1.7–7.7)
Neutrophils Relative %: 30 %
Platelets: 133 10*3/uL — ABNORMAL LOW (ref 150–400)
RBC: 4.5 MIL/uL (ref 4.22–5.81)
RDW: 14.4 % (ref 11.5–15.5)
Smear Review: NORMAL
WBC: 12.4 10*3/uL — ABNORMAL HIGH (ref 4.0–10.5)
nRBC: 0 % (ref 0.0–0.2)

## 2021-01-18 LAB — COMPREHENSIVE METABOLIC PANEL
ALT: 9 U/L (ref 0–44)
AST: 23 U/L (ref 15–41)
Albumin: 4.2 g/dL (ref 3.5–5.0)
Alkaline Phosphatase: 46 U/L (ref 38–126)
Anion gap: 8 (ref 5–15)
BUN: 22 mg/dL (ref 8–23)
CO2: 28 mmol/L (ref 22–32)
Calcium: 8.8 mg/dL — ABNORMAL LOW (ref 8.9–10.3)
Chloride: 105 mmol/L (ref 98–111)
Creatinine, Ser: 1.04 mg/dL (ref 0.61–1.24)
GFR, Estimated: >60 mL/min (ref >60)
Glucose, Bld: 117 mg/dL — ABNORMAL HIGH (ref 70–99)
Potassium: 4.2 mmol/L (ref 3.5–5.1)
Sodium: 141 mmol/L (ref 135–145)
Total Bilirubin: 1.2 mg/dL (ref 0.3–1.2)
Total Protein: 6.8 g/dL (ref 6.5–8.1)

## 2021-01-18 LAB — LACTATE DEHYDROGENASE: LDH: 143 U/L (ref 98–192)

## 2021-01-18 NOTE — Progress Notes (Signed)
Hematology/Oncology Consult note [facility]  [phone] [phone]  Patient Care Team: Baxter Hire, MD as PCP - General (Internal Medicine) Sindy Guadeloupe, MD as Consulting Physician (Oncology)   Name of the patient: Connor Mays  [id number]  September 02, 1930   Date of visit: 01/18/21  Diagnosis-Rai stage 0 CLL  Chief complaint/ Reason for visit-routine follow-up of CLL   Heme/Onc history: patient is a 87 year old male who was diagnosed with Rai stage 0 CLL back in 2010 when he was living in [place].  Flow cytometry November 2018 showed CD5 positive CD23 positive clonal B-cell population with dim lambda light chain restriction typical of CLL/SLL.  He has not required any treatment so far for this.Patient also has a history of right lower extremity DVT unprovoked in August 2021 for which he is on Eliquis.  Patient has had a hypercoagulable work-up for his DVT back in November 2021 which did not show any evidence of factor V Leiden or prothrombin gene mutation.  No evidence of antiphospholipid antibody syndrome.   Interval history-patient reports doing well overall and denies any changes in his weight or appetite.  Denies any unintentional weight loss or drenching night sweats.  Denies any lumps or bumps anywhere.  ECOG PS- 1 Pain scale- 0   Review of systems- Review of Systems  Constitutional:  Negative for chills, fever, malaise/fatigue and weight loss.  HENT:  Negative for congestion, ear discharge and nosebleeds.   Eyes:  Negative for blurred vision.  Respiratory:  Negative for cough, hemoptysis, sputum production, shortness of breath and wheezing.   Cardiovascular:  Negative for chest pain, palpitations, orthopnea and claudication.  Gastrointestinal:  Negative for abdominal pain, blood in stool, constipation, diarrhea, heartburn, melena, nausea and vomiting.  Genitourinary:  Negative for dysuria, flank pain, frequency, hematuria and  urgency.  Musculoskeletal:  Negative for back pain, joint pain and myalgias.  Skin:  Negative for rash.  Neurological:  Negative for dizziness, tingling, focal weakness, seizures, weakness and headaches.  Endo/Heme/Allergies:  Does not bruise/bleed easily.  Psychiatric/Behavioral:  Negative for depression and suicidal ideas. The patient does not have insomnia.       Allergies  Allergen Reactions   Morphine Other (See Comments)    Pre-syncope     Past Medical History:  Diagnosis Date   Arthritis    lower back   CAD (coronary artery disease)    GERD (gastroesophageal reflux disease)    Heart attack ([place]) 1988   Hyperlipidemia    Hypothyroidism      Past Surgical History:  Procedure Laterality Date   CATARACT EXTRACTION W/PHACO Right 08/13/2018   Procedure: CATARACT EXTRACTION PHACO AND INTRAOCULAR LENS PLACEMENT ([place]) RIGHT;  Surgeon: Eulogio Bear, MD;  Location: [facility];  Service: Ophthalmology;  Laterality: Right;   CATARACT EXTRACTION W/PHACO Left 09/17/2018   Procedure: CATARACT EXTRACTION PHACO AND INTRAOCULAR LENS PLACEMENT ([place])  LEFT;  Surgeon: Eulogio Bear, MD;  Location: [facility];  Service: Ophthalmology;  Laterality: Left;   CORONARY STENT PLACEMENT  2007   heart bypass  1988   HERNIA REPAIR     LEFT HEART CATH AND CORS/GRAFTS ANGIOGRAPHY N/A 05/02/2017   Procedure: LEFT HEART CATH AND CORS/GRAFTS ANGIOGRAPHY;  Surgeon: Yolonda Kida, MD;  Location: [facility];  Service: Cardiovascular;  Laterality: N/A;   TONSILLECTOMY      Social History   Socioeconomic History   Marital status: Widowed    Spouse name: Not  on file   Number of children: Not on file   Years of education: Not on file   Highest education level: Not on file  Occupational History   Not on file  Tobacco Use   Smoking status: Former    Types: Cigars   Smokeless tobacco: Former    Quit date: 1970   Tobacco comments:    quit over 40 yrs ago   Vaping Use   Vaping Use: Never used  Substance and Sexual Activity   Alcohol use: No    Comment: may have a drink 1-2x/yr   Drug use: No   Sexual activity: Not on file  Other Topics Concern   Not on file  Social History Narrative   Not on file   Social Determinants of Health   Financial Resource Strain: Not on file  Food Insecurity: Not on file  Transportation Needs: Not on file  Physical Activity: Not on file  Stress: Not on file  Social Connections: Not on file  Intimate Partner Violence: Not on file    Family History  Problem Relation Age of Onset   Healthy Sister    Bladder Cancer Brother    Healthy Sister      Current Outpatient Medications:    Ascorbic Acid (VITAMIN C) 1000 MG tablet, Take 1,000 mg by mouth daily. , Disp: , Rfl:    carbidopa-levodopa (SINEMET IR) 25-100 MG tablet, Take by mouth., Disp: , Rfl:    cetirizine (ZYRTEC) 10 MG tablet, Take 10 mg by mouth daily as needed for allergies., Disp: , Rfl:    Cholecalciferol (VITAMIN D3) 1000 units CAPS, Take 1,000 Units by mouth daily. , Disp: , Rfl:    cyanocobalamin 1000 MCG tablet, Take by mouth., Disp: , Rfl:    ELIQUIS 5 MG TABS tablet, SMARTSIG:1 Tablet(s) By Mouth Every 12 Hours, Disp: , Rfl:    furosemide (LASIX) 20 MG tablet, Take 20 mg by mouth as needed., Disp: , Rfl:    levothyroxine (SYNTHROID, LEVOTHROID) 50 MCG tablet, Take 50 mcg by mouth daily before breakfast. , Disp: , Rfl:    losartan (COZAAR) 100 MG tablet, Take 100 mg by mouth daily. , Disp: , Rfl:    magnesium gluconate (MAGONATE) 500 MG tablet, Take 500 mg by mouth 2 (two) times daily., Disp: , Rfl:    Melatonin 10 MG TABS, Take by mouth., Disp: , Rfl:    Multiple Vitamins-Minerals (PRESERVISION AREDS PO), Take 1 tablet by mouth daily. , Disp: , Rfl:    potassium chloride (KLOR-CON) 10 MEQ tablet, Take by mouth., Disp: , Rfl:    pramipexole (MIRAPEX) 1 MG tablet, Take by mouth., Disp: , Rfl:    simvastatin (ZOCOR) 40 MG tablet, Take 40  mg by mouth daily., Disp: , Rfl:    traMADol (ULTRAM) 50 MG tablet, Take by mouth every 6 (six) hours as needed., Disp: , Rfl:    acetaminophen (TYLENOL) 500 MG tablet, Take 500 mg by mouth every 6 (six) hours as needed.  (Patient not taking: Reported on 01/18/2021), Disp: , Rfl:    aspirin EC 81 MG tablet, Take 81 mg by mouth daily., Disp: , Rfl:    carvedilol (COREG) 6.25 MG tablet, Take by mouth. (Patient not taking: Reported on 01/18/2021), Disp: , Rfl:    clopidogrel (PLAVIX) 75 MG tablet, Take 75 mg by mouth daily.  (Patient not taking: Reported on 01/18/2021), Disp: , Rfl:    fexofenadine (ALLEGRA) 180 MG tablet, Take 180 mg by mouth daily.  (  Patient not taking: Reported on 01/18/2021), Disp: , Rfl:    gabapentin (NEURONTIN) 300 MG capsule, Take 300 mg by mouth daily. (Patient not taking: Reported on 01/18/2021), Disp: , Rfl:    tamsulosin (FLOMAX) 0.4 MG CAPS capsule, Take 0.4 mg by mouth daily after supper. (Patient not taking: Reported on 01/18/2021), Disp: , Rfl:   Physical exam:  Vitals:   01/18/21 0921  BP: (!) 152/81  Pulse: (!) 57  Temp: (!) 97.3 F (36.3 C)  SpO2: 97%  Weight: 218 lb 14.4 oz (99.3 kg)   Physical Exam Cardiovascular:     Rate and Rhythm: Normal rate and regular rhythm.     Heart sounds: Normal heart sounds.  Pulmonary:     Effort: Pulmonary effort is normal.     Breath sounds: Normal breath sounds.  Abdominal:     General: Bowel sounds are normal.     Palpations: Abdomen is soft.     Comments: No palpable hepatosplenomegaly  Lymphadenopathy:     Comments: No palpable cervical, supraclavicular, axillary or inguinal adenopathy    Skin:    General: Skin is warm and dry.  Neurological:     Mental Status: He is alert and oriented to person, place, and time.     CMP Latest Ref Rng & Units 01/18/2021  Glucose 70 - 99 mg/dL 117(H)  BUN 8 - 23 mg/dL 22  Creatinine 0.61 - 1.24 mg/dL 1.04  Sodium 135 - 145 mmol/L 141  Potassium 3.5 - 5.1 mmol/L 4.2   Chloride 98 - 111 mmol/L 105  CO2 22 - 32 mmol/L 28  Calcium 8.9 - 10.3 mg/dL 8.8(L)  Total Protein 6.5 - 8.1 g/dL 6.8  Total Bilirubin 0.3 - 1.2 mg/dL 1.2  Alkaline Phos 38 - 126 U/L 46  AST 15 - 41 U/L 23  ALT 0 - 44 U/L 9   CBC Latest Ref Rng & Units 01/18/2021  WBC 4.0 - 10.5 K/uL 12.4(H)  Hemoglobin 13.0 - 17.0 g/dL 13.3  Hematocrit 39.0 - 52.0 % 41.8  Platelets 150 - 400 K/uL 133(L)    Assessment and plan- Patient is a 87 y.o. male with Rai stage 0 CLL here for routine follow-up  Patient is doing well clinically with no B symptoms.  No palpable hepatosplenomegaly or lymphadenopathy.  White cell count fluctuates between 12-16.  Absolute lymphocyte count is pending.  Hemoglobin is stable between 13-14.  Platelet count stable between 130s to 160s.  No indication to treat his CLL presently which can be continued to be monitored.  He can also follow-up with Dr. Edwina Barth for this and if there are any concerns about worsening cytopenias or leukocytosis he can be referred to [place] in the future  History of right lower extremity DVT: This was in August 2021 and involve the proximal calf vein as well as peroneal vein.  He is presently on Eliquis.  This DVT appears unprovoked.  Hypercoagulable work-up in the past was negative.  It is okay for the patient to stay on Eliquis but if there are any concerns for bleeding or recurrent falls it would be okay to stop the Eliquis at that time.  Decision to continue anticoagulation would be risks versus benefits.  No follow-up required with hematology at this time and can be referred to [place] in the future if questions or concerns arise   Visit Diagnosis 1. CLL (chronic lymphocytic leukemia) ([place])      Dr. Randa Evens, MD, MPH [facility] at [facility] [phone]  01/18/2021 9:43 AM

## 2021-06-14 ENCOUNTER — Encounter: Payer: Self-pay | Admitting: Urology

## 2021-06-14 ENCOUNTER — Ambulatory Visit: Payer: Medicare HMO | Admitting: Urology

## 2021-06-14 VITALS — BP 147/78 | HR 56 | Ht 70.0 in | Wt 212.0 lb

## 2021-06-14 DIAGNOSIS — N401 Enlarged prostate with lower urinary tract symptoms: Secondary | ICD-10-CM

## 2021-06-14 DIAGNOSIS — R3912 Poor urinary stream: Secondary | ICD-10-CM

## 2021-06-14 DIAGNOSIS — N3941 Urge incontinence: Secondary | ICD-10-CM

## 2021-06-14 DIAGNOSIS — R3915 Urgency of urination: Secondary | ICD-10-CM

## 2021-06-14 LAB — MICROSCOPIC EXAMINATION
Bacteria, UA: NONE SEEN
Epithelial Cells (non renal): NONE SEEN /hpf (ref 0–10)
RBC, Urine: NONE SEEN /hpf (ref 0–2)
WBC, UA: NONE SEEN /hpf (ref 0–5)

## 2021-06-14 LAB — URINALYSIS, COMPLETE
Bilirubin, UA: NEGATIVE
Glucose, UA: NEGATIVE
Ketones, UA: NEGATIVE
Leukocytes,UA: NEGATIVE
Nitrite, UA: NEGATIVE
Protein,UA: NEGATIVE
RBC, UA: NEGATIVE
Specific Gravity, UA: 1.02 (ref 1.005–1.030)
Urobilinogen, Ur: 0.2 mg/dL (ref 0.2–1.0)
pH, UA: 5 (ref 5.0–7.5)

## 2021-06-14 LAB — BLADDER SCAN AMB NON-IMAGING: Scan Result: 74

## 2021-06-14 MED ORDER — SILODOSIN 8 MG PO CAPS: 8.0000 mg | ORAL_CAPSULE | Freq: Every day | ORAL | 0 refills | Status: DC

## 2021-06-14 NOTE — Progress Notes (Signed)
? ?06/14/2021 ?4:11 PM  ? ?Connor Mays ?15-Mar-1930 ?[id number] ? ?Referring provider: Baxter Hire, MD ?[place] ?[place],  [place] [address] ? ?Chief Complaint  ?Patient presents with  ? Urinary Frequency  ? ? ?HPI: ?Connor Mays is a 88 y.o. male referred for evaluation of lower urinary tract symptoms. ? ?Followed by neurology for Parkinsonism and at office visit 05/31/2021 had complaint of urinary urgency ?He has a several month history of urinary hesitancy with a slow urinary stream, sensation of incomplete emptying, frequency, urgency and urge incontinence ?Tamsulosin was listed in his medication list however he denies ever taking this medication.  Current medication list does not have tamsulosin listed ?Denies gross hematuria ?No flank, abdominal or pelvic pain ?IPSS today 15/35 with his most bothersome system being a weak urinary stream.  He gets up once per night to void ? ? ? ? ?PMH: ?Past Medical History:  ?Diagnosis Date  ? Arthritis   ? lower back  ? CAD (coronary artery disease)   ? GERD (gastroesophageal reflux disease)   ? Heart attack ([place]) 1988  ? Hyperlipidemia   ? Hypothyroidism   ? ? ?Surgical History: ?Past Surgical History:  ?Procedure Laterality Date  ? CATARACT EXTRACTION W/PHACO Right 08/13/2018  ? Procedure: CATARACT EXTRACTION PHACO AND INTRAOCULAR LENS PLACEMENT ([place]) RIGHT;  Surgeon: Eulogio Bear, MD;  Location: [facility];  Service: Ophthalmology;  Laterality: Right;  ? CATARACT EXTRACTION W/PHACO Left 09/17/2018  ? Procedure: CATARACT EXTRACTION PHACO AND INTRAOCULAR LENS PLACEMENT ([place])  LEFT;  Surgeon: Eulogio Bear, MD;  Location: [facility];  Service: Ophthalmology;  Laterality: Left;  ? CORONARY STENT PLACEMENT  2007  ? heart bypass  1988  ? HERNIA REPAIR    ? LEFT HEART CATH AND CORS/GRAFTS ANGIOGRAPHY N/A 05/02/2017  ? Procedure: LEFT HEART CATH AND CORS/GRAFTS ANGIOGRAPHY;  Surgeon: Yolonda Kida, MD;  Location: [facility];   Service: Cardiovascular;  Laterality: N/A;  ? TONSILLECTOMY    ? ? ?Home Medications:  ?Allergies as of 06/14/2021   ? ?   Reactions  ? Morphine Other (See Comments)  ? Pre-syncope  ? ?  ? ?  ?Medication List  ?  ? ?  ? Accurate as of June 14, 2021  4:11 PM. If you have any questions, ask your nurse or doctor.  ?  ?  ? ?  ? ?STOP taking these medications   ? ?acetaminophen 500 MG tablet ?Commonly known as: TYLENOL ?Stopped by: Abbie Sons, MD ?  ?tamsulosin 0.4 MG Caps capsule ?Commonly known as: FLOMAX ?Stopped by: Abbie Sons, MD ?  ? ?  ? ?TAKE these medications   ? ?aspirin EC 81 MG tablet ?Take 81 mg by mouth daily. ?  ?carbidopa-levodopa 25-100 MG tablet ?Commonly known as: SINEMET IR ?Take by mouth. ?  ?carvedilol 6.25 MG tablet ?Commonly known as: COREG ?Take by mouth. ?  ?cetirizine 10 MG tablet ?Commonly known as: ZYRTEC ?Take 10 mg by mouth daily as needed for allergies. ?  ?clopidogrel 75 MG tablet ?Commonly known as: PLAVIX ?Take 75 mg by mouth daily. ?  ?cyanocobalamin 1000 MCG tablet ?Take by mouth. ?  ?Eliquis 5 MG Tabs tablet ?Generic drug: apixaban ?SMARTSIG:1 Tablet(s) By Mouth Every 12 Hours ?  ?fexofenadine 180 MG tablet ?Commonly known as: ALLEGRA ?Take 180 mg by mouth daily. ?  ?furosemide 20 MG tablet ?Commonly known as: LASIX ?Take 20 mg by mouth as needed. ?  ?gabapentin  300 MG capsule ?Commonly known as: NEURONTIN ?Take 300 mg by mouth daily. ?  ?levothyroxine 50 MCG tablet ?Commonly known as: SYNTHROID ?Take 50 mcg by mouth daily before breakfast. ?  ?losartan 100 MG tablet ?Commonly known as: COZAAR ?Take 100 mg by mouth daily. ?  ?magnesium gluconate 500 MG tablet ?Commonly known as: MAGONATE ?Take 500 mg by mouth 2 (two) times daily. ?  ?Melatonin 10 MG Tabs ?Take by mouth. ?  ?potassium chloride 10 MEQ tablet ?Commonly known as: KLOR-CON ?Take by mouth. ?  ?pramipexole 1 MG tablet ?Commonly known as: MIRAPEX ?Take by mouth. ?  ?PRESERVISION AREDS PO ?Take 1 tablet by mouth  daily. ?  ?silodosin 8 MG Caps capsule ?Commonly known as: RAPAFLO ?Take 1 capsule (8 mg total) by mouth daily with breakfast. ?Started by: Abbie Sons, MD ?  ?simvastatin 40 MG tablet ?Commonly known as: ZOCOR ?Take 40 mg by mouth daily. ?  ?traMADol 50 MG tablet ?Commonly known as: ULTRAM ?Take by mouth every 6 (six) hours as needed. ?  ?vitamin C 1000 MG tablet ?Take 1,000 mg by mouth daily. ?  ?Vitamin D3 25 MCG (1000 UT) Caps ?Take 1,000 Units by mouth daily. ?  ? ?  ? ? ?Allergies:  ?Allergies  ?Allergen Reactions  ? Morphine Other (See Comments)  ?  Pre-syncope  ? ? ?Family History: ?Family History  ?Problem Relation Age of Onset  ? Healthy Sister   ? Bladder Cancer Brother   ? Healthy Sister   ? ? ?Social History:  reports that he has quit smoking. His smoking use included cigars. He quit smokeless tobacco use about 53 years ago. He reports that he does not drink alcohol and does not use drugs. ? ? ?Physical Exam: ?BP (!) 147/78   Pulse (!) 56   Ht '5\' 10"'$  (1.778 m)   Wt 212 lb (96.2 kg)   BMI 30.42 kg/m?   ?Constitutional:  Alert and oriented, No acute distress. ?HEENT: [place] AT, moist mucus membranes.  Trachea midline, no masses. ?GU: Prostate 80+ cc, smooth without nodules ?Psychiatric: Normal mood and affect. ? ? ?Assessment & Plan:   ? ?1.  BPH with LUTS ?Marked prostate enlargement on DRE and his obstructive voiding symptoms are most likely secondary to BPH ?Bladder scan PVR 74 mL ?Storage related voiding symptoms of frequency, urgency and urge incontinence may be secondary to a combination of BPH and Parkinsonism ?Recommend an initial alpha-blocker trial and Rx silodosin 8 mg sent to pharmacy ?1 month follow-up for symptom recheck.  If still having bothersome urge symptoms will add a beta 3 agonist ? ? ?Abbie Sons, MD ? ?[facility] ?[address] ?[place], [place] [address] ?[phone] ?

## 2021-07-12 ENCOUNTER — Ambulatory Visit: Payer: Medicare HMO | Admitting: Urology

## 2021-07-12 ENCOUNTER — Encounter: Payer: Self-pay | Admitting: Urology

## 2021-07-12 VITALS — BP 119/64 | HR 56 | Ht 70.0 in | Wt 211.0 lb

## 2021-07-12 DIAGNOSIS — R3912 Poor urinary stream: Secondary | ICD-10-CM | POA: Diagnosis not present

## 2021-07-12 DIAGNOSIS — N401 Enlarged prostate with lower urinary tract symptoms: Secondary | ICD-10-CM | POA: Diagnosis not present

## 2021-07-12 MED ORDER — SILODOSIN 8 MG PO CAPS: 8.0000 mg | ORAL_CAPSULE | Freq: Every day | ORAL | 3 refills | Status: DC

## 2021-07-12 NOTE — Progress Notes (Signed)
? ?07/12/2021 ?10:43 AM  ? ?Connor Mays ?28-Jul-1930 ?[id number] ? ?Referring provider: Baxter Hire, MD ?[place] ?[place],  [place] [address] ? ?Chief Complaint  ?Patient presents with  ? Benign Prostatic Hypertrophy  ? ? ?HPI: ?88 y.o. male presents for 1 month follow-up. ? ?Refer to prior office note 06/14/2021 ?He has noted marked improvement in his voiding symptoms on silodosin ?Does have some postvoid dribbling which is not bothersome ? ? ? ?PMH: ?Past Medical History:  ?Diagnosis Date  ? Arthritis   ? lower back  ? CAD (coronary artery disease)   ? GERD (gastroesophageal reflux disease)   ? Heart attack ([place]) 1988  ? Hyperlipidemia   ? Hypothyroidism   ? ? ?Surgical History: ?Past Surgical History:  ?Procedure Laterality Date  ? CATARACT EXTRACTION W/PHACO Right 08/13/2018  ? Procedure: CATARACT EXTRACTION PHACO AND INTRAOCULAR LENS PLACEMENT ([facility]) RIGHT;  Surgeon: Eulogio Bear, MD;  Location: [facility];  Service: Ophthalmology;  Laterality: Right;  ? CATARACT EXTRACTION W/PHACO Left 09/17/2018  ? Procedure: CATARACT EXTRACTION PHACO AND INTRAOCULAR LENS PLACEMENT ([facility])  LEFT;  Surgeon: Eulogio Bear, MD;  Location: [facility];  Service: Ophthalmology;  Laterality: Left;  ? CORONARY STENT PLACEMENT  2007  ? heart bypass  1988  ? HERNIA REPAIR    ? LEFT HEART CATH AND CORS/GRAFTS ANGIOGRAPHY N/A 05/02/2017  ? Procedure: LEFT HEART CATH AND CORS/GRAFTS ANGIOGRAPHY;  Surgeon: Yolonda Kida, MD;  Location: [facility];  Service: Cardiovascular;  Laterality: N/A;  ? TONSILLECTOMY    ? ? ?Home Medications:  ?Allergies as of 07/12/2021   ? ?   Reactions  ? Morphine Other (See Comments)  ? Pre-syncope  ? ?  ? ?  ?Medication List  ?  ? ?  ? Accurate as of Jul 12, 2021 10:43 AM. If you have any questions, ask your nurse or doctor.  ?  ?  ? ?  ? ?aspirin EC 81 MG tablet ?Take 81 mg by mouth daily. ?  ?carbidopa-levodopa 25-100 MG tablet ?Commonly known as: SINEMET IR ?Take  by mouth. ?  ?carvedilol 6.25 MG tablet ?Commonly known as: COREG ?Take by mouth. ?  ?cetirizine 10 MG tablet ?Commonly known as: ZYRTEC ?Take 10 mg by mouth daily as needed for allergies. ?  ?clopidogrel 75 MG tablet ?Commonly known as: PLAVIX ?Take 75 mg by mouth daily. ?  ?cyanocobalamin 1000 MCG tablet ?Take by mouth. ?  ?Eliquis 5 MG Tabs tablet ?Generic drug: apixaban ?SMARTSIG:1 Tablet(s) By Mouth Every 12 Hours ?  ?fexofenadine 180 MG tablet ?Commonly known as: ALLEGRA ?Take 180 mg by mouth daily. ?  ?furosemide 20 MG tablet ?Commonly known as: LASIX ?Take 20 mg by mouth as needed. ?  ?gabapentin 300 MG capsule ?Commonly known as: NEURONTIN ?Take 300 mg by mouth daily. ?  ?levothyroxine 50 MCG tablet ?Commonly known as: SYNTHROID ?Take 50 mcg by mouth daily before breakfast. ?  ?losartan 100 MG tablet ?Commonly known as: COZAAR ?Take 100 mg by mouth daily. ?  ?magnesium gluconate 500 MG tablet ?Commonly known as: MAGONATE ?Take 500 mg by mouth 2 (two) times daily. ?  ?Melatonin 10 MG Tabs ?Take by mouth. ?  ?potassium chloride 10 MEQ tablet ?Commonly known as: KLOR-CON ?Take by mouth. ?  ?pramipexole 1 MG tablet ?Commonly known as: MIRAPEX ?Take by mouth. ?  ?PRESERVISION AREDS PO ?Take 1 tablet by mouth daily. ?  ?silodosin 8 MG Caps capsule ?Commonly known as: RAPAFLO ?  Take 1 capsule (8 mg total) by mouth daily with breakfast. ?  ?simvastatin 40 MG tablet ?Commonly known as: ZOCOR ?Take 40 mg by mouth daily. ?  ?traMADol 50 MG tablet ?Commonly known as: ULTRAM ?Take by mouth every 6 (six) hours as needed. ?  ?vitamin C 1000 MG tablet ?Take 1,000 mg by mouth daily. ?  ?Vitamin D3 25 MCG (1000 UT) Caps ?Take 1,000 Units by mouth daily. ?  ? ?  ? ? ?Allergies:  ?Allergies  ?Allergen Reactions  ? Morphine Other (See Comments)  ?  Pre-syncope  ? ? ?Family History: ?Family History  ?Problem Relation Age of Onset  ? Healthy Sister   ? Bladder Cancer Brother   ? Healthy Sister   ? ? ?Social History:  reports that  he has quit smoking. His smoking use included cigars. He quit smokeless tobacco use about 53 years ago. He reports that he does not drink alcohol and does not use drugs. ? ? ?Physical Exam: ?BP 119/64   Pulse (!) 56   Ht '5\' 10"'$  (1.778 m)   Wt 211 lb (95.7 kg)   BMI 30.28 kg/m?   ?Constitutional:  Alert and oriented, No acute distress. ?HEENT: [place] AT, moist mucus membranes.  Trachea midline, no masses. ?Psychiatric: Normal mood and affect. ? ? ?Assessment & Plan:   ? ?1.  BPH with LUTS ?Significant improvement in voiding symptoms on silodosin ?Refills sent to pharmacy ?Follow-up 1 year with IPSS/PVR ?Instructed call earlier for any significant change in voiding pattern ? ? ?Abbie Sons, MD ? ?[facility] ?[address], [address] ?[place],  [address] ?[phone] ?

## 2021-10-13 ENCOUNTER — Other Ambulatory Visit
Admission: RE | Admit: 2021-10-13 | Discharge: 2021-10-13 | Disposition: A | Discharge status: Home / Self Care | Payer: Medicare HMO | Source: Ambulatory Visit | Attending: Student | Admitting: Student

## 2021-10-13 DIAGNOSIS — Z79899 Other long term (current) drug therapy: Secondary | ICD-10-CM | POA: Diagnosis present

## 2021-10-13 DIAGNOSIS — R7989 Other specified abnormal findings of blood chemistry: Secondary | ICD-10-CM | POA: Diagnosis not present

## 2021-10-13 LAB — BRAIN NATRIURETIC PEPTIDE: B Natriuretic Peptide: 213 pg/mL — ABNORMAL HIGH (ref 0.0–100.0)

## 2021-10-15 ENCOUNTER — Other Ambulatory Visit: Payer: Self-pay | Admitting: Orthopedic Surgery

## 2021-10-15 DIAGNOSIS — S46001A Unspecified injury of muscle(s) and tendon(s) of the rotator cuff of right shoulder, initial encounter: Secondary | ICD-10-CM

## 2021-10-29 ENCOUNTER — Ambulatory Visit
Admission: RE | Admit: 2021-10-29 | Discharge: 2021-10-29 | Disposition: A | Discharge status: Home / Self Care | Payer: Medicare HMO | Source: Ambulatory Visit | Attending: Orthopedic Surgery | Admitting: Orthopedic Surgery

## 2021-10-29 DIAGNOSIS — S46001A Unspecified injury of muscle(s) and tendon(s) of the rotator cuff of right shoulder, initial encounter: Secondary | ICD-10-CM

## 2021-11-22 ENCOUNTER — Ambulatory Visit: Payer: Medicare HMO | Admitting: Dermatology

## 2021-11-22 ENCOUNTER — Encounter: Payer: Self-pay | Admitting: Dermatology

## 2021-11-22 DIAGNOSIS — D492 Neoplasm of unspecified behavior of bone, soft tissue, and skin: Secondary | ICD-10-CM

## 2021-11-22 DIAGNOSIS — L821 Other seborrheic keratosis: Secondary | ICD-10-CM

## 2021-11-22 DIAGNOSIS — L578 Other skin changes due to chronic exposure to nonionizing radiation: Secondary | ICD-10-CM | POA: Diagnosis not present

## 2021-11-22 DIAGNOSIS — L814 Other melanin hyperpigmentation: Secondary | ICD-10-CM

## 2021-11-22 DIAGNOSIS — D2371 Other benign neoplasm of skin of right lower limb, including hip: Secondary | ICD-10-CM

## 2021-11-22 NOTE — Patient Instructions (Signed)
Wound Care Instructions  Cleanse wound gently with soap and water once a day then pat dry with clean gauze. Apply a thin coat of Petrolatum (petroleum jelly, "Vaseline") over the wound (unless you have an allergy to this). We recommend that you use a new, sterile tube of Vaseline. Do not pick or remove scabs. Do not remove the yellow or white "healing tissue" from the base of the wound.  Cover the wound with fresh, clean, nonstick gauze and secure with paper tape. You may use Band-Aids in place of gauze and tape if the wound is small enough, but would recommend trimming much of the tape off as there is often too much. Sometimes Band-Aids can irritate the skin.  You should call the office for your biopsy report after 1 week if you have not already been contacted.  If you experience any problems, such as abnormal amounts of bleeding, swelling, significant bruising, significant pain, or evidence of infection, please call the office immediately.  FOR ADULT SURGERY PATIENTS: If you need something for pain relief you may take 1 extra strength Tylenol (acetaminophen) AND 2 Ibuprofen (200mg each) together every 4 hours as needed for pain. (do not take these if you are allergic to them or if you have a reason you should not take them.) Typically, you may only need pain medication for 1 to 3 days.     Due to recent changes in healthcare laws, you may see results of your pathology and/or laboratory studies on MyChart before the doctors have had a chance to review them. We understand that in some cases there may be results that are confusing or concerning to you. Please understand that not all results are received at the same time and often the doctors may need to interpret multiple results in order to provide you with the best plan of care or course of treatment. Therefore, we ask that you please give us 2 business days to thoroughly review all your results before contacting the office for clarification. Should  we see a critical lab result, you will be contacted sooner.   If You Need Anything After Your Visit  If you have any questions or concerns for your doctor, please call our main line at [phone] and press option 4 to reach your doctor's medical assistant. If no one answers, please leave a voicemail as directed and we will return your call as soon as possible. Messages left after 4 pm will be answered the following business day.   You may also send us a message via MyChart. We typically respond to MyChart messages within 1-2 business days.  For prescription refills, please ask your pharmacy to contact our office. Our fax number is [phone].  If you have an urgent issue when the clinic is closed that cannot wait until the next business day, you can page your doctor at the number below.    Please note that while we do our best to be available for urgent issues outside of office hours, we are not available 24/7.   If you have an urgent issue and are unable to reach us, you may choose to seek medical care at your doctor's office, retail clinic, urgent care center, or emergency room.  If you have a medical emergency, please immediately call 911 or go to the emergency department.  Pager Numbers  - Dr. Kowalski: [phone]  - Dr. Moye: [phone]  - Dr. Stewart: [phone]  In the event of inclement weather, please call our main line at   [phone] for an update on the status of any delays or closures.  Dermatology Medication Tips: Please keep the boxes that topical medications come in in order to help keep track of the instructions about where and how to use these. Pharmacies typically print the medication instructions only on the boxes and not directly on the medication tubes.   If your medication is too expensive, please contact our office at [phone] option 4 or send us a message through MyChart.   We are unable to tell what your co-pay for medications will be in  advance as this is different depending on your insurance coverage. However, we may be able to find a substitute medication at lower cost or fill out paperwork to get insurance to cover a needed medication.   If a prior authorization is required to get your medication covered by your insurance company, please allow us 1-2 business days to complete this process.  Drug prices often vary depending on where the prescription is filled and some pharmacies may offer cheaper prices.  The website www.goodrx.com contains coupons for medications through different pharmacies. The prices here do not account for what the cost may be with help from insurance (it may be cheaper with your insurance), but the website can give you the price if you did not use any insurance.  - You can print the associated coupon and take it with your prescription to the pharmacy.  - You may also stop by our office during regular business hours and pick up a GoodRx coupon card.  - If you need your prescription sent electronically to a different pharmacy, notify our office through [facility] MyChart or by phone at [phone] option 4.     Si Usted Necesita Algo Despus de Su Visita  Tambin puede enviarnos un mensaje a travs de MyChart. Por lo general respondemos a los mensajes de MyChart en el transcurso de 1 a 2 das hbiles.  Para renovar recetas, por favor pida a su farmacia que se ponga en contacto con nuestra oficina. Nuestro nmero de fax es el [phone].  Si tiene un asunto urgente cuando la clnica est cerrada y que no puede esperar hasta el siguiente da hbil, puede llamar/localizar a su doctor(a) al nmero que aparece a continuacin.   Por favor, tenga en cuenta que aunque hacemos todo lo posible para estar disponibles para asuntos urgentes fuera del horario de oficina, no estamos disponibles las 24 horas del da, los 7 das de la semana.   Si tiene un problema urgente y no puede comunicarse con nosotros, puede  optar por buscar atencin mdica  en el consultorio de su doctor(a), en una clnica privada, en un centro de atencin urgente o en una sala de emergencias.  Si tiene una emergencia mdica, por favor llame inmediatamente al 911 o vaya a la sala de emergencias.  Nmeros de bper  - Dr. Kowalski: [phone]  - Dra. Moye: [phone]  - Dra. Stewart: [phone]  En caso de inclemencias del tiempo, por favor llame a nuestra lnea principal al [phone] para una actualizacin sobre el estado de cualquier retraso o cierre.  Consejos para la medicacin en dermatologa: Por favor, guarde las cajas en las que vienen los medicamentos de uso tpico para ayudarle a seguir las instrucciones sobre dnde y cmo usarlos. Las farmacias generalmente imprimen las instrucciones del medicamento slo en las cajas y no directamente en los tubos del medicamento.   Si su medicamento es muy caro, por favor, pngase en contacto con   nuestra oficina llamando al [phone] y presione la opcin 4 o envenos un mensaje a travs de MyChart.   No podemos decirle cul ser su copago por los medicamentos por adelantado ya que esto es diferente dependiendo de la cobertura de su seguro. Sin embargo, es posible que podamos encontrar un medicamento sustituto a menor costo o llenar un formulario para que el seguro cubra el medicamento que se considera necesario.   Si se requiere una autorizacin previa para que su compaa de seguros cubra su medicamento, por favor permtanos de 1 a 2 das hbiles para completar este proceso.  Los precios de los medicamentos varan con frecuencia dependiendo del lugar de dnde se surte la receta y alguna farmacias pueden ofrecer precios ms baratos.  El sitio web www.goodrx.com tiene cupones para medicamentos de diferentes farmacias. Los precios aqu no tienen en cuenta lo que podra costar con la ayuda del seguro (puede ser ms barato con su seguro), pero el sitio web puede darle el  precio si no utiliz ningn seguro.  - Puede imprimir el cupn correspondiente y llevarlo con su receta a la farmacia.  - Tambin puede pasar por nuestra oficina durante el horario de atencin regular y recoger una tarjeta de cupones de GoodRx.  - Si necesita que su receta se enve electrnicamente a una farmacia diferente, informe a nuestra oficina a travs de MyChart de [facility] o por telfono llamando al [phone] y presione la opcin 4.  

## 2021-11-22 NOTE — Progress Notes (Signed)
New Patient Visit  Subjective  Connor Mays is a 88 y.o. male who presents for the following: lesion (Right lower leg. Dur: several months. Non tender. Will bleed if picked at). The patient has spots, moles and lesions to be evaluated, some may be new or changing and the patient has concerns that these could be cancer.  Review of Systems: No other skin or systemic complaints except as noted in HPI or Assessment and Plan.  Objective  Well appearing patient in no apparent distress; mood and affect are within normal limits.  A focused examination was performed including right lower leg. Relevant physical exam findings are noted in the Assessment and Plan.  Right Pretibial 1.2 cm erythematous firm nodule        Assessment & Plan   Neoplasm of skin Right Pretibial Epidermal / dermal shaving  Lesion diameter (cm):  1.2 Informed consent: discussed and consent obtained   Timeout: patient name, date of birth, surgical site, and procedure verified   Procedure prep:  Patient was prepped and draped in usual sterile fashion Prep type:  Isopropyl alcohol Anesthesia: the lesion was anesthetized in a standard fashion   Anesthetic:  1% lidocaine w/ epinephrine 1-100,000 buffered w/ 8.4% NaHCO3 Instrument used: flexible razor blade   Hemostasis achieved with: pressure, aluminum chloride and electrodesiccation   Outcome: patient tolerated procedure well   Post-procedure details: sterile dressing applied and wound care instructions given   Dressing type: bandage and petrolatum    Destruction of lesion Complexity: extensive   Destruction method: electrodesiccation and curettage   Informed consent: discussed and consent obtained   Timeout:  patient name, date of birth, surgical site, and procedure verified Procedure prep:  Patient was prepped and draped in usual sterile fashion Prep type:  Isopropyl alcohol Anesthesia: the lesion was anesthetized in a standard fashion   Anesthetic:  1%  lidocaine w/ epinephrine 1-100,000 buffered w/ 8.4% NaHCO3 Curettage performed in three different directions: Yes   Electrodesiccation performed over the curetted area: Yes   Curettage cycles:  3 Lesion length (cm):  1.2 Lesion width (cm):  1.2 Margin per side (cm):  0.2 Final wound size (cm):  1.6 Hemostasis achieved with:  pressure and aluminum chloride Outcome: patient tolerated procedure well with no complications   Post-procedure details: sterile dressing applied and wound care instructions given   Dressing type: bandage and petrolatum    Specimen 1 - Surgical pathology Differential Diagnosis: R/O SCC Check Margins: No  Actinic Damage - chronic, secondary to cumulative UV radiation exposure/sun exposure over time - diffuse scaly erythematous macules with underlying dyspigmentation - Recommend daily broad spectrum sunscreen SPF 30+ to sun-exposed areas, reapply every 2 hours as needed.  - Recommend staying in the shade or wearing long sleeves, sun glasses (UVA+UVB protection) and wide brim hats (4-inch brim around the entire circumference of the hat). - Call for new or changing lesions.  Lentigines - Scattered tan macules - Due to sun exposure - Benign-appering, observe - Recommend daily broad spectrum sunscreen SPF 30+ to sun-exposed areas, reapply every 2 hours as needed. - Call for any changes  Seborrheic Keratoses - Stuck-on, waxy, tan-brown papules and/or plaques  - Benign-appearing - Discussed benign etiology and prognosis. - Observe - Call for any changes  Return in about 6 months (around 05/23/2022) for Biopsy Follow Up, TBSE.  I, Emelia Salisbury, CMA, am acting as scribe for Sarina Ser, MD. Documentation: I have reviewed the above documentation for accuracy and completeness, and I agree with  the above.  Sarina Ser, MD

## 2021-11-25 ENCOUNTER — Encounter: Payer: Self-pay | Admitting: Dermatology

## 2021-11-25 ENCOUNTER — Telehealth: Payer: Self-pay

## 2021-11-25 NOTE — Telephone Encounter (Signed)
Patient informed of pathology results 

## 2021-11-25 NOTE — Telephone Encounter (Signed)
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

## 2021-12-10 ENCOUNTER — Encounter: Payer: Self-pay | Admitting: Urology

## 2021-12-29 ENCOUNTER — Encounter: Payer: Self-pay | Admitting: Oncology

## 2021-12-29 NOTE — Telephone Encounter (Signed)
I dont see his recent cbc in the system. Have it faxed to me and I will comment on it next week

## 2022-01-03 ENCOUNTER — Encounter: Payer: Self-pay | Admitting: Urology

## 2022-01-05 ENCOUNTER — Telehealth: Payer: Self-pay | Admitting: Nurse Practitioner

## 2022-01-05 NOTE — Telephone Encounter (Signed)
Received mychart message from patient's daughter Anderson Malta re: concerns of recent bloodwork abnormalities. Patient was followed by Dr Janese Banks and released from clinic in November 2022. I reached out to Manuela Schwartz, other daughter, by phone and left vm. Jennifer's phone number wasn't available.

## 2022-02-01 ENCOUNTER — Emergency Department
Admission: EM | Admit: 2022-02-01 | Discharge: 2022-02-01 | Disposition: A | Discharge status: Home / Self Care | Payer: Medicare HMO | Attending: Emergency Medicine | Admitting: Emergency Medicine

## 2022-02-01 ENCOUNTER — Encounter: Payer: Self-pay | Admitting: Emergency Medicine

## 2022-02-01 ENCOUNTER — Other Ambulatory Visit: Payer: Self-pay

## 2022-02-01 ENCOUNTER — Emergency Department: Payer: Medicare HMO

## 2022-02-01 DIAGNOSIS — R0602 Shortness of breath: Secondary | ICD-10-CM | POA: Diagnosis present

## 2022-02-01 DIAGNOSIS — R2243 Localized swelling, mass and lump, lower limb, bilateral: Secondary | ICD-10-CM | POA: Insufficient documentation

## 2022-02-01 DIAGNOSIS — R5383 Other fatigue: Secondary | ICD-10-CM | POA: Diagnosis not present

## 2022-02-01 DIAGNOSIS — I509 Heart failure, unspecified: Secondary | ICD-10-CM | POA: Insufficient documentation

## 2022-02-01 DIAGNOSIS — R600 Localized edema: Secondary | ICD-10-CM

## 2022-02-01 LAB — BASIC METABOLIC PANEL
Anion gap: 4 — ABNORMAL LOW (ref 5–15)
BUN: 22 mg/dL (ref 8–23)
CO2: 26 mmol/L (ref 22–32)
Calcium: 8.8 mg/dL — ABNORMAL LOW (ref 8.9–10.3)
Chloride: 112 mmol/L — ABNORMAL HIGH (ref 98–111)
Creatinine, Ser: 1.1 mg/dL (ref 0.61–1.24)
GFR, Estimated: >60 mL/min (ref >60)
Glucose, Bld: 130 mg/dL — ABNORMAL HIGH (ref 70–99)
Potassium: 4.4 mmol/L (ref 3.5–5.1)
Sodium: 142 mmol/L (ref 135–145)

## 2022-02-01 LAB — CBC
HCT: 37.9 % — ABNORMAL LOW (ref 39.0–52.0)
Hemoglobin: 12 g/dL — ABNORMAL LOW (ref 13.0–17.0)
MCH: 29.9 pg (ref 26.0–34.0)
MCHC: 31.7 g/dL (ref 30.0–36.0)
MCV: 94.5 fL (ref 80.0–100.0)
Platelets: 116 10*3/uL — ABNORMAL LOW (ref 150–400)
RBC: 4.01 MIL/uL — ABNORMAL LOW (ref 4.22–5.81)
RDW: 15.1 % (ref 11.5–15.5)
WBC: 14.6 10*3/uL — ABNORMAL HIGH (ref 4.0–10.5)
nRBC: 0 % (ref 0.0–0.2)

## 2022-02-01 LAB — HEPATIC FUNCTION PANEL
ALT: 16 U/L (ref 0–44)
AST: 33 U/L (ref 15–41)
Albumin: 3.9 g/dL (ref 3.5–5.0)
Alkaline Phosphatase: 53 U/L (ref 38–126)
Bilirubin, Direct: 0.2 mg/dL (ref 0.0–0.2)
Indirect Bilirubin: 0.9 mg/dL (ref 0.3–0.9)
Total Bilirubin: 1.1 mg/dL (ref 0.3–1.2)
Total Protein: 6.6 g/dL (ref 6.5–8.1)

## 2022-02-01 LAB — BRAIN NATRIURETIC PEPTIDE: B Natriuretic Peptide: 367.2 pg/mL — ABNORMAL HIGH (ref 0.0–100.0)

## 2022-02-01 NOTE — ED Provider Notes (Signed)
[facility] Provider Note   Event Date/Time   First MD Initiated Contact with Patient 02/01/22 1208     (approximate) History  Shortness of Breath  HPI Connor Mays is a 88 y.o. male with past medical history of CHF and currently off his torsemide given recent AKI who presents for worsening shortness of breath, dyspnea on exertion, and edematous symptoms.  Patient states that he was seen in clinic today and sent to the emergency department after patient was complaining of worsening shortness of breath symptoms.  Patient states that he has gained at least 10 pounds over the past 2 weeks after stopping his diuretic medication. ROS: Patient currently denies any vision changes, tinnitus, difficulty speaking, facial droop, sore throat, chest pain, abdominal pain, nausea/vomiting/diarrhea, dysuria, or weakness/numbness/paresthesias in any extremity   Physical Exam  Triage Vital Signs: ED Triage Vitals  Enc Vitals Group     BP 02/01/22 1026 128/66     Pulse Rate 02/01/22 1026 (!) 56     Resp 02/01/22 1026 18     Temp 02/01/22 1026 97.6 F (36.4 C)     Temp Source 02/01/22 1026 Oral     SpO2 02/01/22 1026 96 %     Weight 02/01/22 1027 226 lb 9.8 oz (102.8 kg)     Height 02/01/22 1027 '5\' 10"'$  (1.778 m)     Head Circumference --      Peak Flow --      Pain Score 02/01/22 1027 4     Pain Loc --      Pain Edu? --      Excl. in [place]? --    Most recent vital signs: Vitals:   02/01/22 1026 02/01/22 1252  BP: 128/66 125/68  Pulse: (!) 56 60  Resp: 18 18  Temp: 97.6 F (36.4 C)   SpO2: 96% 97%   General: Awake, oriented x4. CV:  Good peripheral perfusion.  Resp:  Normal effort.  Abd:  No distention.  Other:  Elderly overweight Caucasian male sitting in stretcher in no acute distress.  2+ pitting edema to lower extremities bilaterally ED Results / Procedures / Treatments  Labs (all labs ordered are listed, but only abnormal results are displayed) Labs Reviewed   BASIC METABOLIC PANEL - Abnormal; Notable for the following components:      Result Value   Chloride 112 (*)    Glucose, Bld 130 (*)    Calcium 8.8 (*)    Anion gap 4 (*)    All other components within normal limits  CBC - Abnormal; Notable for the following components:   WBC 14.6 (*)    RBC 4.01 (*)    Hemoglobin 12.0 (*)    HCT 37.9 (*)    Platelets 116 (*)    All other components within normal limits  BRAIN NATRIURETIC PEPTIDE - Abnormal; Notable for the following components:   B Natriuretic Peptide 367.2 (*)    All other components within normal limits  HEPATIC FUNCTION PANEL   EKG ED ECG REPORT I, Naaman Plummer, the attending physician, personally viewed and interpreted this ECG. Date: 02/01/2022 EKG Time: 1032 Rate: 62 Rhythm: Atrial flutter with variable AV block QRS Axis: normal Intervals: normal ST/T Wave abnormalities: normal Narrative Interpretation: Atrial flutter with variable AV block.  No evidence of acute ischemia RADIOLOGY ED MD interpretation: 2 view chest x-ray interpreted by me and shows mild bibasilar atelectasis with pulmonary vascular congestion without edema -Agree with radiology assessment Official radiology  report(s): DG Chest 2 View  Result Date: 02/01/2022 CLINICAL DATA:  Weight gain.  Dyspnea on exertion. EXAM: CHEST - 2 VIEW COMPARISON:  CT chest 05/19/2017 FINDINGS: Postop CABG. Heart size mildly enlarged. Prominent pulmonary vascularity. Negative for pulmonary edema. Mild bibasilar atelectasis. No effusion. IMPRESSION: 1. Mild bibasilar atelectasis. 2. Pulmonary vascular congestion without edema. Electronically Signed   By: Franchot Gallo M.D.   On: 02/01/2022 11:12   PROCEDURES: Critical Care performed: No .1-3 Lead EKG Interpretation  Performed by: Naaman Plummer, MD Authorized by: Naaman Plummer, MD     Interpretation: abnormal     ECG rate:  60   ECG rate assessment: normal     Rhythm: atrial flutter     Ectopy: none      Conduction: normal    MEDICATIONS ORDERED IN ED: Medications - No data to display IMPRESSION / MDM / [place] / ED COURSE  I reviewed the triage vital signs and the nursing notes.                              Patient's presentation is most consistent with acute presentation with potential threat to life or bodily function. Endorses dyspnea endorses LE edema Endorses adherence to medication regimen  Workup: ECG, CBC, BMP, Troponin, BNP, CXR Findings: EKG: No STEMI and no evidence of Brugadas sign, delta wave, epsilon wave, significantly prolonged QTc, or malignant arrhythmia. BNP: 367 CXR: Vascular congestion without edema Based on history, exam and findings, presentation most consistent with acute on chronic heart failure. Low suspicion for PNA, ACS, tamponade, aortic dissection. Interventions: None necessary as patient is not requiring any supplemental oxygenation or showing any signs of significant volume overload  Reassessment: Symptoms improved in ED with oxygen and diuresis  Disposition (Stable): Discharge   FINAL CLINICAL IMPRESSION(S) / ED DIAGNOSES   Final diagnoses:  Congestive heart failure, unspecified HF chronicity, unspecified heart failure type (HCC)  Bilateral lower extremity edema  Other fatigue   Rx / DC Orders   ED Discharge Orders     None      Note:  This document was prepared using Dragon voice recognition software and may include unintentional dictation errors.   Naaman Plummer, MD 02/01/22 [phone]

## 2022-02-01 NOTE — ED Triage Notes (Signed)
Patient arrives in wheelchair with daughter stating they were sent from [place] [facility]. Patient hard of hearing but states he has gained 20lbs since October. Daughter states patient was due for labs today to have kidney function checked due to abnormal labs recently. Was told to stop taking his turosemide.

## 2022-02-01 NOTE — Discharge Instructions (Addendum)
Please begin taking your torsemide again every other day for the next 1-2 weeks until follow-up with your primary care physician for repeat of your kidney function

## 2022-05-23 ENCOUNTER — Ambulatory Visit (INDEPENDENT_AMBULATORY_CARE_PROVIDER_SITE_OTHER): Payer: Medicare HMO | Admitting: Dermatology

## 2022-05-23 VITALS — BP 186/97 | HR 56

## 2022-05-23 DIAGNOSIS — I872 Venous insufficiency (chronic) (peripheral): Secondary | ICD-10-CM | POA: Diagnosis not present

## 2022-05-23 DIAGNOSIS — L570 Actinic keratosis: Secondary | ICD-10-CM

## 2022-05-23 DIAGNOSIS — D229 Melanocytic nevi, unspecified: Secondary | ICD-10-CM

## 2022-05-23 DIAGNOSIS — Z1283 Encounter for screening for malignant neoplasm of skin: Secondary | ICD-10-CM | POA: Diagnosis not present

## 2022-05-23 DIAGNOSIS — L820 Inflamed seborrheic keratosis: Secondary | ICD-10-CM | POA: Diagnosis not present

## 2022-05-23 DIAGNOSIS — D0361 Melanoma in situ of right upper limb, including shoulder: Secondary | ICD-10-CM | POA: Diagnosis not present

## 2022-05-23 DIAGNOSIS — L821 Other seborrheic keratosis: Secondary | ICD-10-CM

## 2022-05-23 DIAGNOSIS — D2372 Other benign neoplasm of skin of left lower limb, including hip: Secondary | ICD-10-CM | POA: Diagnosis not present

## 2022-05-23 DIAGNOSIS — D039 Melanoma in situ, unspecified: Secondary | ICD-10-CM

## 2022-05-23 DIAGNOSIS — L814 Other melanin hyperpigmentation: Secondary | ICD-10-CM

## 2022-05-23 DIAGNOSIS — L84 Corns and callosities: Secondary | ICD-10-CM

## 2022-05-23 DIAGNOSIS — D239 Other benign neoplasm of skin, unspecified: Secondary | ICD-10-CM

## 2022-05-23 DIAGNOSIS — D485 Neoplasm of uncertain behavior of skin: Secondary | ICD-10-CM

## 2022-05-23 DIAGNOSIS — L578 Other skin changes due to chronic exposure to nonionizing radiation: Secondary | ICD-10-CM

## 2022-05-23 HISTORY — DX: Melanoma in situ, unspecified: D03.9

## 2022-05-23 NOTE — Progress Notes (Unsigned)
Follow-Up Visit   Subjective  Connor Mays is a 89 y.o. male who presents for the following: Annual Exam. The patient presents for Total-Body Skin Exam (TBSE) for skin cancer screening and mole check.  The patient has spots, moles and lesions to be evaluated, some may be new or changing and the patient has concerns that these could be cancer.  The following portions of the chart were reviewed this encounter and updated as appropriate:   Tobacco  Allergies  Meds  Problems  Med Hx  Surg Hx  Fam Hx     Review of Systems:  No other skin or systemic complaints except as noted in HPI or Assessment and Plan.  Objective  Well appearing patient in no apparent distress; mood and affect are within normal limits.  A full examination was performed including scalp, head, eyes, ears, nose, lips, neck, chest, axillae, abdomen, back, buttocks, bilateral upper extremities, bilateral lower extremities, hands, feet, fingers, toes, fingernails, and toenails. All findings within normal limits unless otherwise noted below.  L pretibial Healed biopsy site.   Lower legs Erythematous, scaly patches involving the ankle and distal lower leg with associated lower leg edema.   scalp and ears x 8 (8) Erythematous thin papules/macules with gritty scale.   Arms and hands x 21 (21) Erythematous stuck-on, waxy papule or plaque  R lat bicep 3.5 x 1.5 cm irregular brown macule   R lat tricep 4.5 cm irregular brown macule.  R ant sole Thickened skin.    Assessment & Plan  Angiokeratoma L pretibial Bx proven - Benign-appearing.  Observation.  Call clinic for new or changing lesions.  Recommend daily use of broad spectrum spf 30+ sunscreen to sun-exposed areas.   Stasis dermatitis of both legs Lower legs Stasis in the legs causes chronic leg swelling, which may result in itchy or painful rashes, skin discoloration, skin texture changes, and sometimes ulceration.  Recommend daily graduated compression  hose/stockings- easiest to put on first thing in morning, remove at bedtime.  Elevate legs as much as possible. Avoid salt/sodium rich foods.  AK (actinic keratosis) (8) scalp and ears x 8 Destruction of lesion - scalp and ears x 8 Complexity: simple   Destruction method: cryotherapy   Informed consent: discussed and consent obtained   Timeout:  patient name, date of birth, surgical site, and procedure verified Lesion destroyed using liquid nitrogen: Yes   Region frozen until ice ball extended beyond lesion: Yes   Outcome: patient tolerated procedure well with no complications   Post-procedure details: wound care instructions given    Inflamed seborrheic keratosis (21) Arms and hands x 21 Symptomatic, irritating, patient would like treated. Destruction of lesion - Arms and hands x 21 Complexity: simple   Destruction method: cryotherapy   Informed consent: discussed and consent obtained   Timeout:  patient name, date of birth, surgical site, and procedure verified Lesion destroyed using liquid nitrogen: Yes   Region frozen until ice ball extended beyond lesion: Yes   Outcome: patient tolerated procedure well with no complications   Post-procedure details: wound care instructions given    Neoplasm of uncertain behavior of skin (2) R lat bicep Skin / nail biopsy Type of biopsy: tangential   Informed consent: discussed and consent obtained   Timeout: patient name, date of birth, surgical site, and procedure verified   Procedure prep:  Patient was prepped and draped in usual sterile fashion Prep type:  Isopropyl alcohol Anesthesia: the lesion was anesthetized in a standard  fashion   Anesthetic:  1% lidocaine w/ epinephrine 1-100,000 buffered w/ 8.4% NaHCO3 Instrument used: flexible razor blade   Hemostasis achieved with: pressure, aluminum chloride and electrodesiccation   Outcome: patient tolerated procedure well   Post-procedure details: sterile dressing applied and wound care  instructions given   Dressing type: bandage and petrolatum    Specimen 1 - Surgical pathology Differential Diagnosis: d48.5 r/o MM vs other  Check Margins: No  R lat tricep Skin / nail biopsy Type of biopsy: tangential   Informed consent: discussed and consent obtained   Timeout: patient name, date of birth, surgical site, and procedure verified   Procedure prep:  Patient was prepped and draped in usual sterile fashion Prep type:  Isopropyl alcohol Anesthesia: the lesion was anesthetized in a standard fashion   Anesthetic:  1% lidocaine w/ epinephrine 1-100,000 buffered w/ 8.4% NaHCO3 Instrument used: flexible razor blade   Hemostasis achieved with: pressure, aluminum chloride and electrodesiccation   Outcome: patient tolerated procedure well   Post-procedure details: sterile dressing applied and wound care instructions given   Dressing type: bandage and petrolatum    Specimen 2 - Surgical pathology Differential Diagnosis: D48.5 r/o MM vs other  Check Margins: No  Callus of foot R ant sole Benign, but if bothersome recommend referral to podiatrist. Patient states he is a patient of Dr. Jens Som and he will contact their office to schedule an appointment.   Lentigines - Scattered tan macules - Due to sun exposure - Benign-appearing, observe - Recommend daily broad spectrum sunscreen SPF 30+ to sun-exposed areas, reapply every 2 hours as needed. - Call for any changes  Seborrheic Keratoses - Stuck-on, waxy, tan-brown papules and/or plaques  - Benign-appearing - Discussed benign etiology and prognosis. - Observe - Call for any changes  Melanocytic Nevi - Tan-brown and/or pink-flesh-colored symmetric macules and papules - Benign appearing on exam today - Observation - Call clinic for new or changing moles - Recommend daily use of broad spectrum spf 30+ sunscreen to sun-exposed areas.   Hemangiomas - Red papules - Discussed benign nature - Observe - Call for any  changes  Actinic Damage - Chronic condition, secondary to cumulative UV/sun exposure - diffuse scaly erythematous macules with underlying dyspigmentation - Recommend daily broad spectrum sunscreen SPF 30+ to sun-exposed areas, reapply every 2 hours as needed.  - Staying in the shade or wearing long sleeves, sun glasses (UVA+UVB protection) and wide brim hats (4-inch brim around the entire circumference of the hat) are also recommended for sun protection.  - Call for new or changing lesions.  Skin cancer screening performed today.  Return in about 6 months (around 11/23/2022).  Luther Redo, CMA, am acting as scribe for Sarina Ser, MD . Documentation: I have reviewed the above documentation for accuracy and completeness, and I agree with the above.  Sarina Ser, MD

## 2022-05-23 NOTE — Patient Instructions (Addendum)
Wound Care Instructions  Cleanse wound gently with soap and water once a day then pat dry with clean gauze. Apply a thin coat of Petrolatum (petroleum jelly, "Vaseline") over the wound (unless you have an allergy to this). We recommend that you use a new, sterile tube of Vaseline. Do not pick or remove scabs. Do not remove the yellow or white "healing tissue" from the base of the wound.  Cover the wound with fresh, clean, nonstick gauze and secure with paper tape. You may use Band-Aids in place of gauze and tape if the wound is small enough, but would recommend trimming much of the tape off as there is often too much. Sometimes Band-Aids can irritate the skin.  You should call the office for your biopsy report after 1 week if you have not already been contacted.  If you experience any problems, such as abnormal amounts of bleeding, swelling, significant bruising, significant pain, or evidence of infection, please call the office immediately.  FOR ADULT SURGERY PATIENTS: If you need something for pain relief you may take 1 extra strength Tylenol (acetaminophen) AND 2 Ibuprofen (200mg each) together every 4 hours as needed for pain. (do not take these if you are allergic to them or if you have a reason you should not take them.) Typically, you may only need pain medication for 1 to 3 days.     Due to recent changes in healthcare laws, you may see results of your pathology and/or laboratory studies on MyChart before the doctors have had a chance to review them. We understand that in some cases there may be results that are confusing or concerning to you. Please understand that not all results are received at the same time and often the doctors may need to interpret multiple results in order to provide you with the best plan of care or course of treatment. Therefore, we ask that you please give us 2 business days to thoroughly review all your results before contacting the office for clarification. Should  we see a critical lab result, you will be contacted sooner.   If You Need Anything After Your Visit  If you have any questions or concerns for your doctor, please call our main line at [phone] and press option 4 to reach your doctor's medical assistant. If no one answers, please leave a voicemail as directed and we will return your call as soon as possible. Messages left after 4 pm will be answered the following business day.   You may also send us a message via MyChart. We typically respond to MyChart messages within 1-2 business days.  For prescription refills, please ask your pharmacy to contact our office. Our fax number is [phone].  If you have an urgent issue when the clinic is closed that cannot wait until the next business day, you can page your doctor at the number below.    Please note that while we do our best to be available for urgent issues outside of office hours, we are not available 24/7.   If you have an urgent issue and are unable to reach us, you may choose to seek medical care at your doctor's office, retail clinic, urgent care center, or emergency room.  If you have a medical emergency, please immediately call 911 or go to the emergency department.  Pager Numbers  - Dr. Kowalski: [phone]  - Dr. Moye: [phone]  - Dr. Stewart: [phone]  In the event of inclement weather, please call our main line at   [phone] for an update on the status of any delays or closures.  Dermatology Medication Tips: Please keep the boxes that topical medications come in in order to help keep track of the instructions about where and how to use these. Pharmacies typically print the medication instructions only on the boxes and not directly on the medication tubes.   If your medication is too expensive, please contact our office at [phone] option 4 or send us a message through MyChart.   We are unable to tell what your co-pay for medications will be in  advance as this is different depending on your insurance coverage. However, we may be able to find a substitute medication at lower cost or fill out paperwork to get insurance to cover a needed medication.   If a prior authorization is required to get your medication covered by your insurance company, please allow us 1-2 business days to complete this process.  Drug prices often vary depending on where the prescription is filled and some pharmacies may offer cheaper prices.  The website www.goodrx.com contains coupons for medications through different pharmacies. The prices here do not account for what the cost may be with help from insurance (it may be cheaper with your insurance), but the website can give you the price if you did not use any insurance.  - You can print the associated coupon and take it with your prescription to the pharmacy.  - You may also stop by our office during regular business hours and pick up a GoodRx coupon card.  - If you need your prescription sent electronically to a different pharmacy, notify our office through [facility] MyChart or by phone at [phone] option 4.     Si Usted Necesita Algo Despus de Su Visita  Tambin puede enviarnos un mensaje a travs de MyChart. Por lo general respondemos a los mensajes de MyChart en el transcurso de 1 a 2 das hbiles.  Para renovar recetas, por favor pida a su farmacia que se ponga en contacto con nuestra oficina. Nuestro nmero de fax es el [phone].  Si tiene un asunto urgente cuando la clnica est cerrada y que no puede esperar hasta el siguiente da hbil, puede llamar/localizar a su doctor(a) al nmero que aparece a continuacin.   Por favor, tenga en cuenta que aunque hacemos todo lo posible para estar disponibles para asuntos urgentes fuera del horario de oficina, no estamos disponibles las 24 horas del da, los 7 das de la semana.   Si tiene un problema urgente y no puede comunicarse con nosotros, puede  optar por buscar atencin mdica  en el consultorio de su doctor(a), en una clnica privada, en un centro de atencin urgente o en una sala de emergencias.  Si tiene una emergencia mdica, por favor llame inmediatamente al 911 o vaya a la sala de emergencias.  Nmeros de bper  - Dr. Kowalski: [phone]  - Dra. Moye: [phone]  - Dra. Stewart: [phone]  En caso de inclemencias del tiempo, por favor llame a nuestra lnea principal al [phone] para una actualizacin sobre el estado de cualquier retraso o cierre.  Consejos para la medicacin en dermatologa: Por favor, guarde las cajas en las que vienen los medicamentos de uso tpico para ayudarle a seguir las instrucciones sobre dnde y cmo usarlos. Las farmacias generalmente imprimen las instrucciones del medicamento slo en las cajas y no directamente en los tubos del medicamento.   Si su medicamento es muy caro, por favor, pngase en contacto con   nuestra oficina llamando al [phone] y presione la opcin 4 o envenos un mensaje a travs de MyChart.   No podemos decirle cul ser su copago por los medicamentos por adelantado ya que esto es diferente dependiendo de la cobertura de su seguro. Sin embargo, es posible que podamos encontrar un medicamento sustituto a menor costo o llenar un formulario para que el seguro cubra el medicamento que se considera necesario.   Si se requiere una autorizacin previa para que su compaa de seguros cubra su medicamento, por favor permtanos de 1 a 2 das hbiles para completar este proceso.  Los precios de los medicamentos varan con frecuencia dependiendo del lugar de dnde se surte la receta y alguna farmacias pueden ofrecer precios ms baratos.  El sitio web www.goodrx.com tiene cupones para medicamentos de diferentes farmacias. Los precios aqu no tienen en cuenta lo que podra costar con la ayuda del seguro (puede ser ms barato con su seguro), pero el sitio web puede darle el  precio si no utiliz ningn seguro.  - Puede imprimir el cupn correspondiente y llevarlo con su receta a la farmacia.  - Tambin puede pasar por nuestra oficina durante el horario de atencin regular y recoger una tarjeta de cupones de GoodRx.  - Si necesita que su receta se enve electrnicamente a una farmacia diferente, informe a nuestra oficina a travs de MyChart de  o por telfono llamando al [phone] y presione la opcin 4.  

## 2022-05-25 ENCOUNTER — Encounter: Payer: Self-pay | Admitting: Dermatology

## 2022-05-30 ENCOUNTER — Telehealth: Payer: Self-pay

## 2022-05-30 NOTE — Telephone Encounter (Signed)
Connor Mays from [facility] diagnostic calling to give verbal biopsy results   Right lateral tricep- Melanoma in situ-margins involved  Right lateral bicep -melanoma in situ-margins involved Report will be faxed here today   Dr Raliegh Ip patient

## 2022-06-01 ENCOUNTER — Telehealth: Payer: Self-pay

## 2022-06-01 NOTE — Telephone Encounter (Signed)
-----   Message from Brendolyn Patty, MD sent at 06/01/2022 12:51 PM EDT ----- 1. Skin , right lat bicep MELANOMA IN SITU, LENTIGO MALIGNA TYPE, PERIPHERAL MARGIN INVOLVED 2. Skin , right lat tricep MELANOMA IN SITU, PERIPHERAL MARGIN INVOLVED   Melanoma in situ skin cancer  x 2, Called pt and discussed result, needs additional treatment (WLE).  Since they are adjacent may be able to excise together in one visit- please call patient.  Please call and schedule with Dr. Raliegh Ip

## 2022-06-01 NOTE — Telephone Encounter (Signed)
Spoke with patient and scheduled him for surgery.

## 2022-06-20 ENCOUNTER — Other Ambulatory Visit: Payer: Self-pay | Admitting: Student

## 2022-06-20 DIAGNOSIS — M545 Low back pain, unspecified: Secondary | ICD-10-CM

## 2022-06-20 DIAGNOSIS — G2581 Restless legs syndrome: Secondary | ICD-10-CM

## 2022-06-21 ENCOUNTER — Ambulatory Visit
Admission: RE | Admit: 2022-06-21 | Discharge: 2022-06-21 | Disposition: A | Discharge status: Home / Self Care | Payer: Medicare HMO | Source: Ambulatory Visit | Attending: Student | Admitting: Student

## 2022-06-21 DIAGNOSIS — G8929 Other chronic pain: Secondary | ICD-10-CM | POA: Insufficient documentation

## 2022-06-21 DIAGNOSIS — M545 Low back pain, unspecified: Secondary | ICD-10-CM | POA: Insufficient documentation

## 2022-06-21 DIAGNOSIS — G2581 Restless legs syndrome: Secondary | ICD-10-CM | POA: Diagnosis present

## 2022-06-28 ENCOUNTER — Encounter: Payer: Self-pay | Admitting: Dermatology

## 2022-06-28 ENCOUNTER — Ambulatory Visit (INDEPENDENT_AMBULATORY_CARE_PROVIDER_SITE_OTHER): Payer: Medicare HMO | Admitting: Dermatology

## 2022-06-28 DIAGNOSIS — D0339 Melanoma in situ of other parts of face: Secondary | ICD-10-CM | POA: Diagnosis not present

## 2022-06-28 DIAGNOSIS — Z7189 Other specified counseling: Secondary | ICD-10-CM

## 2022-06-28 DIAGNOSIS — D0361 Melanoma in situ of right upper limb, including shoulder: Secondary | ICD-10-CM | POA: Diagnosis not present

## 2022-06-28 DIAGNOSIS — D485 Neoplasm of uncertain behavior of skin: Secondary | ICD-10-CM

## 2022-06-28 DIAGNOSIS — L578 Other skin changes due to chronic exposure to nonionizing radiation: Secondary | ICD-10-CM | POA: Diagnosis not present

## 2022-06-28 DIAGNOSIS — L570 Actinic keratosis: Secondary | ICD-10-CM | POA: Diagnosis not present

## 2022-06-28 NOTE — Patient Instructions (Addendum)
Cryotherapy Aftercare  Wash gently with soap and water everyday.   Apply Vaseline and Band-Aid daily until healed.   Shave Excision Benign Lesion Wound Care Instructions  Leave the original bandage on for 24 hours if possible.  If the bandage becomes soaked or soiled before that time, it is OK to remove it and examine the wound.  A small amount of post-operative bleeding is normal.  If excessive bleeding occurs, remove the bandage, place gauze over the site and apply continuous pressure (no peeking) over the area for 20-30 minutes.  If this does not stop the bleeding, try again for 40 minutes.  If this does not work, please call our clinic as soon as possible (even if after-hours).    Twice a day, cleanse the wound with soap and water.  If a thick crust develops you may use a Q-tip dipped into dilute hydrogen peroxide (mix 1:1 with water) to dissolve it.  Hydrogen peroxide can slow the healing process, so use it only as needed.  After washing, apply Vaseline jelly or Polysporin ointment.  For best healing, the wound should be covered with a layer of ointment at all times.  This may mean re-applying the ointment several times a day.  For open wounds, continue until it has healed.    If you have any swelling, keep the area elevated.  Some redness, tenderness and white or yellow material in the wound is normal healing.  If the area becomes very sore and red, or develops a thick yellow-green material (pus), it may be infected; please notify us.    Wound healing continues for up to one year following surgery.  It is not unusual to experience pain in the scar from time to time during the interval.  If the pain becomes severe or the scar thickens, you should notify the office.  A slight amount of redness in a scar is expected for the first six months.  After six months, the redness subsides and the scar will soften and fade.  The color difference becomes less noticeable with time.  If there are any problems,  return for a post-op surgery check at your earliest convenience.  Please call our office for any questions or concerns.    Due to recent changes in healthcare laws, you may see results of your pathology and/or laboratory studies on MyChart before the doctors have had a chance to review them. We understand that in some cases there may be results that are confusing or concerning to you. Please understand that not all results are received at the same time and often the doctors may need to interpret multiple results in order to provide you with the best plan of care or course of treatment. Therefore, we ask that you please give us 2 business days to thoroughly review all your results before contacting the office for clarification. Should we see a critical lab result, you will be contacted sooner.   If You Need Anything After Your Visit  If you have any questions or concerns for your doctor, please call our main line at [phone] and press option 4 to reach your doctor's medical assistant. If no one answers, please leave a voicemail as directed and we will return your call as soon as possible. Messages left after 4 pm will be answered the following business day.   You may also send us a message via MyChart. We typically respond to MyChart messages within 1-2 business days.  For prescription refills, please ask your   pharmacy to contact our office. Our fax number is [phone].  If you have an urgent issue when the clinic is closed that cannot wait until the next business day, you can page your doctor at the number below.    Please note that while we do our best to be available for urgent issues outside of office hours, we are not available 24/7.   If you have an urgent issue and are unable to reach us, you may choose to seek medical care at your doctor's office, retail clinic, urgent care center, or emergency room.  If you have a medical emergency, please immediately call 911 or go to the  emergency department.  Pager Numbers  - Dr. Kowalski: [phone]  - Dr. Moye: [phone]  - Dr. Stewart: [phone]  In the event of inclement weather, please call our main line at [phone] for an update on the status of any delays or closures.  Dermatology Medication Tips: Please keep the boxes that topical medications come in in order to help keep track of the instructions about where and how to use these. Pharmacies typically print the medication instructions only on the boxes and not directly on the medication tubes.   If your medication is too expensive, please contact our office at [phone] option 4 or send us a message through MyChart.   We are unable to tell what your co-pay for medications will be in advance as this is different depending on your insurance coverage. However, we may be able to find a substitute medication at lower cost or fill out paperwork to get insurance to cover a needed medication.   If a prior authorization is required to get your medication covered by your insurance company, please allow us 1-2 business days to complete this process.  Drug prices often vary depending on where the prescription is filled and some pharmacies may offer cheaper prices.  The website www.goodrx.com contains coupons for medications through different pharmacies. The prices here do not account for what the cost may be with help from insurance (it may be cheaper with your insurance), but the website can give you the price if you did not use any insurance.  - You can print the associated coupon and take it with your prescription to the pharmacy.  - You may also stop by our office during regular business hours and pick up a GoodRx coupon card.  - If you need your prescription sent electronically to a different pharmacy, notify our office through [facility] MyChart or by phone at [phone] option 4.     Si Usted Necesita Algo Despus de Su Visita  Tambin puede  enviarnos un mensaje a travs de MyChart. Por lo general respondemos a los mensajes de MyChart en el transcurso de 1 a 2 das hbiles.  Para renovar recetas, por favor pida a su farmacia que se ponga en contacto con nuestra oficina. Nuestro nmero de fax es el [phone].  Si tiene un asunto urgente cuando la clnica est cerrada y que no puede esperar hasta el siguiente da hbil, puede llamar/localizar a su doctor(a) al nmero que aparece a continuacin.   Por favor, tenga en cuenta que aunque hacemos todo lo posible para estar disponibles para asuntos urgentes fuera del horario de oficina, no estamos disponibles las 24 horas del da, los 7 das de la semana.   Si tiene un problema urgente y no puede comunicarse con nosotros, puede optar por buscar atencin mdica  en el consultorio de su doctor(a), en   una clnica privada, en un centro de atencin urgente o en una sala de emergencias.  Si tiene una emergencia mdica, por favor llame inmediatamente al 911 o vaya a la sala de emergencias.  Nmeros de bper  - Dr. Kowalski: [phone]  - Dra. Moye: [phone]  - Dra. Stewart: [phone]  En caso de inclemencias del tiempo, por favor llame a nuestra lnea principal al [phone] para una actualizacin sobre el estado de cualquier retraso o cierre.  Consejos para la medicacin en dermatologa: Por favor, guarde las cajas en las que vienen los medicamentos de uso tpico para ayudarle a seguir las instrucciones sobre dnde y cmo usarlos. Las farmacias generalmente imprimen las instrucciones del medicamento slo en las cajas y no directamente en los tubos del medicamento.   Si su medicamento es muy caro, por favor, pngase en contacto con nuestra oficina llamando al [phone] y presione la opcin 4 o envenos un mensaje a travs de MyChart.   No podemos decirle cul ser su copago por los medicamentos por adelantado ya que esto es diferente dependiendo de la cobertura de su seguro.  Sin embargo, es posible que podamos encontrar un medicamento sustituto a menor costo o llenar un formulario para que el seguro cubra el medicamento que se considera necesario.   Si se requiere una autorizacin previa para que su compaa de seguros cubra su medicamento, por favor permtanos de 1 a 2 das hbiles para completar este proceso.  Los precios de los medicamentos varan con frecuencia dependiendo del lugar de dnde se surte la receta y alguna farmacias pueden ofrecer precios ms baratos.  El sitio web www.goodrx.com tiene cupones para medicamentos de diferentes farmacias. Los precios aqu no tienen en cuenta lo que podra costar con la ayuda del seguro (puede ser ms barato con su seguro), pero el sitio web puede darle el precio si no utiliz ningn seguro.  - Puede imprimir el cupn correspondiente y llevarlo con su receta a la farmacia.  - Tambin puede pasar por nuestra oficina durante el horario de atencin regular y recoger una tarjeta de cupones de GoodRx.  - Si necesita que su receta se enve electrnicamente a una farmacia diferente, informe a nuestra oficina a travs de MyChart de [place] o por telfono llamando al [phone] y presione la opcin 4.  

## 2022-06-28 NOTE — Progress Notes (Signed)
Follow-Up Visit   Subjective  Connor Mays is a 89 y.o. 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  Objective  Well appearing patient in no apparent distress; mood and affect are within normal limits. A focused examination was performed of the following areas: Face, arms, hands Relevant exam findings are noted in the Assessment and Plan.  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 (HCC) (2) Right lat bicep  Destruction of lesion Complexity: simple   Destruction method: cryotherapy   Informed consent: discussed and consent obtained   Timeout:  patient name, date of birth, surgical site, and procedure verified Lesion destroyed using liquid nitrogen: Yes   Region frozen until ice ball extended beyond lesion: Yes   Lesion length (cm):  3.5 Lesion width (cm):  3.5 Margin per side (cm):  0.5 Final wound size (cm):  4.5 Outcome: patient tolerated procedure well with no complications   Post-procedure details: wound care instructions given    Right lat tricep  Destruction of lesion Complexity: simple   Destruction method: cryotherapy   Informed consent: discussed and consent obtained   Timeout:  patient name, date of birth, surgical site, and procedure verified Lesion destroyed using liquid nitrogen: Yes   Region frozen until ice ball extended beyond lesion: Yes   Lesion length (cm):  4.5 Lesion width (cm):  4.5 Margin per side (cm):  0.5 Final wound size (cm):  5.5 Outcome: patient  tolerated procedure well with no complications   Post-procedure details: wound care instructions given    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  Discussed treatment recommendations in detail advising that treatment recommendations are based on longitudinal studies and retrospective studies and are nationwide protocols. Discussed LN2 followed by topical chemotherapy treatment vs excision. Due to size of lesions and diagnosis of lentigo maligna type, recommend LN2 followed by topical chemotherapy cream on follow up. Advised there is always potential for melanoma recurrence even after definitive treatment.  After definitive treatment, we recommend Skin Cancer Screening Exams (with total-body skin exams) every 3 months for a year; then every 4 months for a year; then every 6 months for 3 years.  At 5 years post treatment, if all appears well,  we would recommend at least yearly Skin Cancer Screenings (with total-body skin exams) for the rest of your life.  The patient was given time for questions and these were answered.  We recommend frequent self skin examinations; photoprotection with sunscreen, sun protective clothing, hats, sunglasses and sun avoidance.  If the patient notices any new or changing skin lesions the patient should return to the office immediately for evaluation.    Neoplasm of uncertain behavior of skin Left cheek  Skin / nail biopsy Type of biopsy: tangential   Informed consent: discussed and consent obtained   Timeout: patient name, date  of birth, surgical site, and procedure verified   Procedure prep:  Patient was prepped and draped in usual sterile fashion Prep type:  Isopropyl alcohol Anesthesia: the lesion was anesthetized in a standard fashion   Anesthetic:  1% lidocaine w/ epinephrine 1-100,000  buffered w/ 8.4% NaHCO3 Instrument used: flexible razor blade   Hemostasis achieved with: pressure, aluminum chloride and electrodesiccation   Outcome: patient tolerated procedure well   Post-procedure details: sterile dressing applied and wound care instructions given   Dressing type: bandage and petrolatum    Specimen 1 - Surgical pathology Differential Diagnosis: SK vs Lentigo Maligna Check Margins: No  Actinic keratosis  Actinic skin damage  Counseling and coordination of care  ACTINIC KERATOSIS Exam: Erythematous thin papules/macules with gritty scale  Actinic keratoses are precancerous spots that appear secondary to cumulative UV radiation exposure/sun exposure over time. They are chronic with expected duration over 1 year. A portion of actinic keratoses will progress to squamous cell carcinoma of the skin. It is not possible to reliably predict which spots will progress to skin cancer and so treatment is recommended to prevent development of skin cancer.  Recommend daily broad spectrum sunscreen SPF 30+ to sun-exposed areas, reapply every 2 hours as needed.  Recommend staying in the shade or wearing long sleeves, sun glasses (UVA+UVB protection) and wide brim hats (4-inch brim around the entire circumference of the hat). Call for new or changing lesions.  Treatment Plan:  Prior to procedure, discussed risks of blister formation, small wound, skin dyspigmentation, or rare scar following cryotherapy. Recommend Vaseline ointment to treated areas while healing.  Destruction Procedure Note Destruction method: cryotherapy   Informed consent: discussed and consent obtained   Lesion destroyed using liquid nitrogen: Yes   Outcome: patient tolerated procedure well with no complications   Post-procedure details: wound care instructions given   Locations: hands # of Lesions Treated: 18  ACTINIC DAMAGE - chronic, secondary to cumulative UV radiation exposure/sun exposure over  time - diffuse scaly erythematous macules with underlying dyspigmentation - Recommend daily broad spectrum sunscreen SPF 30+ to sun-exposed areas, reapply every 2 hours as needed.  - Recommend staying in the shade or wearing long sleeves, sun glasses (UVA+UVB protection) and wide brim hats (4-inch brim around the entire circumference of the hat). - Call for new or changing lesions.  Return for 6-8 weeks - follow up for melanoma in situ.  I, Joanie Coddington, CMA, am acting as scribe for [name], MD .  Documentation: I have reviewed the above documentation for accuracy and completeness, and I agree with the above.  [name], MD

## 2022-07-04 ENCOUNTER — Telehealth: Payer: Self-pay

## 2022-07-04 NOTE — Telephone Encounter (Signed)
-----   Message from David C Kowalski, MD sent at 07/04/2022  2:41 PM EDT ----- Diagnosis Skin , left cheek MELANOMA IN SITU, LENTIGO MALIGNA TYPE, PERIPHERAL AND DEEP MARGINS INVOLVED, SEE DESCRIPTION  Cancer = Melanoma in situ, Lentigo Maligna type Superficial Will discuss treatment options at next visit  I called and no answer - left voicemail that this Left cheek spot is same as the two cancers on his Right arm that we started treatment on last visit and will plan to continue treatment with topical Chemotherapy. Advised we will likely treat the Left cheek lesion similarly and we will discuss at next visit Aug 11, 2022.  Please call pt and advise of above.  If he feels he needs to talk to me by phone or be seen earlier than Aug 11, 2022, we can arrange that. 

## 2022-07-04 NOTE — Telephone Encounter (Signed)
Left voicemail to return my call

## 2022-07-06 ENCOUNTER — Telehealth: Payer: Self-pay

## 2022-07-06 NOTE — Telephone Encounter (Signed)
-----   Message from Deirdre Evener, MD sent at 07/04/2022  2:41 PM EDT ----- Diagnosis Skin , left cheek MELANOMA IN SITU, LENTIGO MALIGNA TYPE, PERIPHERAL AND DEEP MARGINS INVOLVED, SEE DESCRIPTION  Cancer = Melanoma in situ, Lentigo Maligna type Superficial Will discuss treatment options at next visit  I called and no answer - left voicemail that this Left cheek spot is same as the two cancers on his Right arm that we started treatment on last visit and will plan to continue treatment with topical Chemotherapy. Advised we will likely treat the Left cheek lesion similarly and we will discuss at next visit Aug 11, 2022.  Please call pt and advise of above.  If he feels he needs to talk to me by phone or be seen earlier than Aug 11, 2022, we can arrange that.

## 2022-07-06 NOTE — Telephone Encounter (Signed)
Discussed pathology results with patient. He prefers to be seen sooner. Appointment scheduled for tomorrow at 4:15 pm.

## 2022-07-07 ENCOUNTER — Encounter: Payer: Self-pay | Admitting: Dermatology

## 2022-07-07 ENCOUNTER — Ambulatory Visit: Payer: Medicare HMO | Admitting: Dermatology

## 2022-07-07 DIAGNOSIS — L821 Other seborrheic keratosis: Secondary | ICD-10-CM

## 2022-07-07 DIAGNOSIS — Z7189 Other specified counseling: Secondary | ICD-10-CM | POA: Diagnosis not present

## 2022-07-07 DIAGNOSIS — D0339 Melanoma in situ of other parts of face: Secondary | ICD-10-CM | POA: Diagnosis not present

## 2022-07-07 DIAGNOSIS — L578 Other skin changes due to chronic exposure to nonionizing radiation: Secondary | ICD-10-CM

## 2022-07-07 DIAGNOSIS — D0361 Melanoma in situ of right upper limb, including shoulder: Secondary | ICD-10-CM

## 2022-07-07 NOTE — Patient Instructions (Signed)
Cryotherapy Aftercare  Wash gently with soap and water everyday.   Apply Vaseline and Band-Aid daily until healed.     Due to recent changes in healthcare laws, you may see results of your pathology and/or laboratory studies on MyChart before the doctors have had a chance to review them. We understand that in some cases there may be results that are confusing or concerning to you. Please understand that not all results are received at the same time and often the doctors may need to interpret multiple results in order to provide you with the best plan of care or course of treatment. Therefore, we ask that you please give us 2 business days to thoroughly review all your results before contacting the office for clarification. Should we see a critical lab result, you will be contacted sooner.   If You Need Anything After Your Visit  If you have any questions or concerns for your doctor, please call our main line at [phone] and press option 4 to reach your doctor's medical assistant. If no one answers, please leave a voicemail as directed and we will return your call as soon as possible. Messages left after 4 pm will be answered the following business day.   You may also send us a message via MyChart. We typically respond to MyChart messages within 1-2 business days.  For prescription refills, please ask your pharmacy to contact our office. Our fax number is [phone].  If you have an urgent issue when the clinic is closed that cannot wait until the next business day, you can page your doctor at the number below.    Please note that while we do our best to be available for urgent issues outside of office hours, we are not available 24/7.   If you have an urgent issue and are unable to reach us, you may choose to seek medical care at your doctor's office, retail clinic, urgent care center, or emergency room.  If you have a medical emergency, please immediately call 911 or go to the  emergency department.  Pager Numbers  - Dr. Kowalski: [phone]  - Dr. Moye: [phone]  - Dr. Stewart: [phone]  In the event of inclement weather, please call our main line at [phone] for an update on the status of any delays or closures.  Dermatology Medication Tips: Please keep the boxes that topical medications come in in order to help keep track of the instructions about where and how to use these. Pharmacies typically print the medication instructions only on the boxes and not directly on the medication tubes.   If your medication is too expensive, please contact our office at [phone] option 4 or send us a message through MyChart.   We are unable to tell what your co-pay for medications will be in advance as this is different depending on your insurance coverage. However, we may be able to find a substitute medication at lower cost or fill out paperwork to get insurance to cover a needed medication.   If a prior authorization is required to get your medication covered by your insurance company, please allow us 1-2 business days to complete this process.  Drug prices often vary depending on where the prescription is filled and some pharmacies may offer cheaper prices.  The website www.goodrx.com contains coupons for medications through different pharmacies. The prices here do not account for what the cost may be with help from insurance (it may be cheaper with your insurance), but the website can   give you the price if you did not use any insurance.  - You can print the associated coupon and take it with your prescription to the pharmacy.  - You may also stop by our office during regular business hours and pick up a GoodRx coupon card.  - If you need your prescription sent electronically to a different pharmacy, notify our office through [facility] MyChart or by phone at [phone] option 4.     Si Usted Necesita Algo Despus de Su Visita  Tambin puede  enviarnos un mensaje a travs de MyChart. Por lo general respondemos a los mensajes de MyChart en el transcurso de 1 a 2 das hbiles.  Para renovar recetas, por favor pida a su farmacia que se ponga en contacto con nuestra oficina. Nuestro nmero de fax es el [phone].  Si tiene un asunto urgente cuando la clnica est cerrada y que no puede esperar hasta el siguiente da hbil, puede llamar/localizar a su doctor(a) al nmero que aparece a continuacin.   Por favor, tenga en cuenta que aunque hacemos todo lo posible para estar disponibles para asuntos urgentes fuera del horario de oficina, no estamos disponibles las 24 horas del da, los 7 das de la semana.   Si tiene un problema urgente y no puede comunicarse con nosotros, puede optar por buscar atencin mdica  en el consultorio de su doctor(a), en una clnica privada, en un centro de atencin urgente o en una sala de emergencias.  Si tiene una emergencia mdica, por favor llame inmediatamente al 911 o vaya a la sala de emergencias.  Nmeros de bper  - Dr. Kowalski: [phone]  - Dra. Moye: [phone]  - Dra. Stewart: [phone]  En caso de inclemencias del tiempo, por favor llame a nuestra lnea principal al [phone] para una actualizacin sobre el estado de cualquier retraso o cierre.  Consejos para la medicacin en dermatologa: Por favor, guarde las cajas en las que vienen los medicamentos de uso tpico para ayudarle a seguir las instrucciones sobre dnde y cmo usarlos. Las farmacias generalmente imprimen las instrucciones del medicamento slo en las cajas y no directamente en los tubos del medicamento.   Si su medicamento es muy caro, por favor, pngase en contacto con nuestra oficina llamando al [phone] y presione la opcin 4 o envenos un mensaje a travs de MyChart.   No podemos decirle cul ser su copago por los medicamentos por adelantado ya que esto es diferente dependiendo de la cobertura de su seguro.  Sin embargo, es posible que podamos encontrar un medicamento sustituto a menor costo o llenar un formulario para que el seguro cubra el medicamento que se considera necesario.   Si se requiere una autorizacin previa para que su compaa de seguros cubra su medicamento, por favor permtanos de 1 a 2 das hbiles para completar este proceso.  Los precios de los medicamentos varan con frecuencia dependiendo del lugar de dnde se surte la receta y alguna farmacias pueden ofrecer precios ms baratos.  El sitio web www.goodrx.com tiene cupones para medicamentos de diferentes farmacias. Los precios aqu no tienen en cuenta lo que podra costar con la ayuda del seguro (puede ser ms barato con su seguro), pero el sitio web puede darle el precio si no utiliz ningn seguro.  - Puede imprimir el cupn correspondiente y llevarlo con su receta a la farmacia.  - Tambin puede pasar por nuestra oficina durante el horario de atencin regular y recoger una tarjeta de cupones de GoodRx.  -   Si necesita que su receta se enve electrnicamente a una farmacia diferente, informe a nuestra oficina a travs de MyChart de [facility] o por telfono llamando al [phone] y presione la opcin 4.  

## 2022-07-07 NOTE — Progress Notes (Signed)
Follow-Up Visit   Subjective  Connor Mays is a 89 y.o. male who presents for the following: melanoma in situ follow up. 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 Here today to discuss treatment options.   On 05/23/22 patient had biopsies x 2 done at right lat bicep and right lat tricep showing MELANOMA IN SITU, LENTIGO MALIGNA TYPE at bicep and MELANOMA IN SITU at tricep. Both areas were treated with LN2 at last visit 06/28/22 and will start topical chemo soon.  Patient accompanied by daughter.   The following portions of the chart were reviewed this encounter and updated as appropriate: medications, allergies, medical history  Review of Systems:  No other skin or systemic complaints except as noted in HPI or Assessment and Plan.  Objective  Well appearing patient in no apparent distress; mood and affect are within normal limits.   A focused examination was performed of the following areas: Arms, face  Relevant exam findings are noted in the Assessment and Plan.  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 Anesthesia: the lesion was anesthetized in a standard fashion   Anesthetic:  1% lidocaine w/ epinephrine 1-100,000 local infiltration Final wound size (cm):  3.5 Hemostasis achieved with:  pressure Outcome: patient tolerated procedure well with no complications   Post-procedure details: wound care instructions given   Post-procedure details comment:  Ointment and small bandage applied  MELANOMA IN SITU, LENTIGO MALIGNA TYPE at  bicep and MELANOMA IN SITU at tricep. Both areas were treated with LN2 at last visit 06/28/22 and will start topical chemo soon. right lateral bicep, right lateral tricep Will plan to treat all three areas with topical chemotherapy (imiquimod) once healed from LN2.   Area at right bicep and tricep cleaned with Puracyn, mupirocin applied, covered with non-stick gauze and wrapped with Coban.   COUNSELING: Melanoma Matrix Counseling and Coordination of Care  Discussed diagnosis in detail including significance of melanoma diagnosis which can be potentially lethal.  Discussed treatment recommendations in detail advising that treatment recommendations are based on longitudinal studies and retrospective studies and are nationwide protocols.  Advised there is always potential for melanoma recurrence even after definitive treatment.  After definitive treatment, we recommend Skin Cancer Screening Exams (with total-body skin exams) every 3 months for a year; then every 4 months for a year; then every 6 months for 3 years.  At 5 years post treatment, if all appears well,  we would recommend at least yearly Skin Cancer Screenings (with total-body skin exams) for the rest of your life.  The patient was given time for questions and these were answered.  We recommend frequent self skin examinations; photoprotection with sunscreen, sun protective clothing, hats, sunglasses and sun avoidance.  If the patient notices any new or changing skin lesions the patient should return to the office immediately for evaluation.   SEBORRHEIC KERATOSIS - Stuck-on, waxy, tan-brown papules and/or plaques  - Benign-appearing - Discussed benign etiology and prognosis. - Observe - Call for any changes  ACTINIC DAMAGE - chronic, secondary to  cumulative UV radiation exposure/sun exposure over time - diffuse scaly erythematous macules with underlying dyspigmentation - Recommend daily broad spectrum sunscreen SPF 30+ to sun-exposed  areas, reapply every 2 hours as needed.  - Recommend staying in the shade or wearing long sleeves, sun glasses (UVA+UVB protection) and wide brim hats (4-inch brim around the entire circumference of the hat). - Call for new or changing lesions.  Return for as scheduled.  Anise Salvo, RMA, am acting as scribe for [name], MD .  Documentation: I have reviewed the above documentation for accuracy and completeness, and I agree with the above.  [name], MD

## 2022-07-11 ENCOUNTER — Encounter: Payer: Self-pay | Admitting: Dermatology

## 2022-07-15 ENCOUNTER — Encounter: Payer: Self-pay | Admitting: Dermatology

## 2022-07-15 ENCOUNTER — Ambulatory Visit: Payer: Medicare HMO | Admitting: Urology

## 2022-07-27 ENCOUNTER — Encounter: Payer: Self-pay | Admitting: Urology

## 2022-07-27 ENCOUNTER — Ambulatory Visit: Payer: Medicare HMO | Admitting: Urology

## 2022-07-27 VITALS — BP 128/74 | HR 90 | Ht 72.0 in | Wt 226.0 lb

## 2022-07-27 DIAGNOSIS — N401 Enlarged prostate with lower urinary tract symptoms: Secondary | ICD-10-CM

## 2022-07-27 DIAGNOSIS — R3915 Urgency of urination: Secondary | ICD-10-CM

## 2022-07-27 DIAGNOSIS — R3912 Poor urinary stream: Secondary | ICD-10-CM | POA: Diagnosis not present

## 2022-07-27 LAB — BLADDER SCAN AMB NON-IMAGING: Scan Result: 5

## 2022-07-27 MED ORDER — FINASTERIDE 5 MG PO TABS: 5.0000 mg | ORAL_TABLET | Freq: Every day | ORAL | 3 refills | Status: AC

## 2022-07-27 NOTE — Progress Notes (Signed)
I, Duke Salvia, acting as a Neurosurgeon for Riki Altes, MD., have documented all relevant documentation on the behalf of Riki Altes, MD, as directed by  Riki Altes, MD while in the presence of Riki Altes, MD.   07/27/2022 9:40 AM   [name] [birth year] [id number]  Referring provider: Gracelyn Nurse, MD [address] [place],  [place] [address]  Chief Complaint  Patient presents with   Benign Prostatic Hypertrophy    Urologic History:  BPH with LUTS -Controlled on Silidosin   HPI: Connor Mays is a 89 y.o. male presenting for annual follow up.  Initially seen on 06/14/2021 with moderate lower urinary tract symptoms-IPSS 15/35, with most bothersome symptom being a weak urinary stream. He was given trial of Silidosin and on one month follow up had noted significant improvement in his voiding pattern and elected to continue Silidosin. Since last year's visit, he complains of urinary hesitancy and a slow stream. IPSS tody 16/35. Remains on Silidosin. States his urgency has significantly improved.   PMH: Past Medical History:  Diagnosis Date   Arthritis    lower back   CAD (coronary artery disease)    GERD (gastroesophageal reflux disease)    Heart attack (HCC) 1988   Hyperlipidemia    Hypothyroidism    Malignant melanoma in situ (HCC) 05/23/2022   R lat tricep Lentigo Maligna type  - LN2 followed by topical chemo cream   Malignant melanoma in situ (HCC) 05/23/2022   R lat bicep Lentigo Maligna type  - LN2 followed by topical chemo cream   Malignant melanoma in situ (HCC) 06/28/2022   L cheek - lentigo maligna type, will discuss tx options at appt in May    Surgical History: Past Surgical History:  Procedure Laterality Date   CATARACT EXTRACTION W/PHACO Right 08/13/2018   Procedure: CATARACT EXTRACTION PHACO AND INTRAOCULAR LENS PLACEMENT (IOC) RIGHT;  Surgeon: Nevada Crane, MD;  Location: [facility];  Service: Ophthalmology;   Laterality: Right;   CATARACT EXTRACTION W/PHACO Left 09/17/2018   Procedure: CATARACT EXTRACTION PHACO AND INTRAOCULAR LENS PLACEMENT (IOC)  LEFT;  Surgeon: Nevada Crane, MD;  Location: [facility];  Service: Ophthalmology;  Laterality: Left;   CORONARY STENT PLACEMENT  2007   heart bypass  1988   HERNIA REPAIR     LEFT HEART CATH AND CORS/GRAFTS ANGIOGRAPHY N/A 05/02/2017   Procedure: LEFT HEART CATH AND CORS/GRAFTS ANGIOGRAPHY;  Surgeon: Alwyn Pea, MD;  Location: [facility];  Service: Cardiovascular;  Laterality: N/A;   TONSILLECTOMY      Home Medications:  Allergies as of 07/27/2022       Reactions   Morphine Other (See Comments)   Pre-syncope        Medication List        Accurate as of Jul 27, 2022  9:40 AM. If you have any questions, ask your nurse or doctor.          STOP taking these medications    aspirin EC 81 MG tablet Stopped by: Riki Altes, MD   cetirizine 10 MG tablet Commonly known as: ZYRTEC Stopped by: Riki Altes, MD   cyanocobalamin 1000 MCG tablet Stopped by: Riki Altes, MD   Eliquis 5 MG Tabs tablet Generic drug: apixaban Stopped by: Riki Altes, MD   fexofenadine 180 MG tablet Commonly known as: ALLEGRA Stopped by: Riki Altes, MD   gabapentin 300 MG capsule  Commonly known as: NEURONTIN Stopped by: Riki Altes, MD   magnesium gluconate 500 MG tablet Commonly known as: MAGONATE Stopped by: Riki Altes, MD   Melatonin 10 MG Tabs Stopped by: Riki Altes, MD   potassium chloride 10 MEQ tablet Commonly known as: KLOR-CON Stopped by: Riki Altes, MD       TAKE these medications    carbidopa-levodopa 25-100 MG tablet Commonly known as: SINEMET IR Take by mouth.   carvedilol 6.25 MG tablet Commonly known as: COREG Take by mouth.   clopidogrel 75 MG tablet Commonly known as: PLAVIX Take 75 mg by mouth daily.   finasteride 5 MG tablet Commonly known as:  PROSCAR Take 1 tablet (5 mg total) by mouth daily. Started by: Riki Altes, MD   furosemide 20 MG tablet Commonly known as: LASIX Take 20 mg by mouth as needed.   levothyroxine 50 MCG tablet Commonly known as: SYNTHROID Take 50 mcg by mouth daily before breakfast.   losartan 100 MG tablet Commonly known as: COZAAR Take 100 mg by mouth daily.   pramipexole 1 MG tablet Commonly known as: MIRAPEX Take by mouth.   PRESERVISION AREDS PO Take 1 tablet by mouth daily.   silodosin 8 MG Caps capsule Commonly known as: RAPAFLO Take 1 capsule (8 mg total) by mouth daily with breakfast.   simvastatin 40 MG tablet Commonly known as: ZOCOR Take 40 mg by mouth daily.   traMADol 50 MG tablet Commonly known as: ULTRAM Take by mouth every 6 (six) hours as needed.   vitamin C 1000 MG tablet Take 1,000 mg by mouth daily.   Vitamin D3 25 MCG (1000 UT) Caps Take 1,000 Units by mouth daily.        Allergies:  Allergies  Allergen Reactions   Morphine Other (See Comments)    Pre-syncope    Family History: Family History  Problem Relation Age of Onset   Healthy Sister    Bladder Cancer Brother    Healthy Sister     Social History:  reports that he has quit smoking. His smoking use included cigars. He quit smokeless tobacco use about 54 years ago. He reports that he does not drink alcohol and does not use drugs.   Physical Exam: BP 128/74   Pulse 90   Ht 6' (1.829 m)   Wt 226 lb (102.5 kg)   BMI 30.65 kg/m   Constitutional:  Alert and oriented, No acute distress. HEENT: [place] AT, moist mucus membranes.  Trachea midline, no masses. Psychiatric: Normal mood and affect.   Assessment & Plan:    1.  BPH with LUTS Bothersome hesitancy/weak stream. Symptoms aren't bothersome enough that he desires surgical intervention, though would be interested in adding another medication. RX Finasteride 5 mg daily sent to pharmacy. 6 month PA follow up for symptom check. He was  informed that this medication will take at least 6 months to determine efficacy. PVR today 5 mL.  I have reviewed the above documentation for accuracy and completeness, and I agree with the above.   Riki Altes, MD  [facility] [address] [place], [place] [address] [phone]

## 2022-07-28 ENCOUNTER — Ambulatory Visit: Payer: Medicare HMO | Admitting: Urology

## 2022-08-11 ENCOUNTER — Encounter: Payer: Self-pay | Admitting: Dermatology

## 2022-08-11 ENCOUNTER — Ambulatory Visit: Payer: Medicare HMO | Admitting: Dermatology

## 2022-08-11 VITALS — BP 131/67

## 2022-08-11 DIAGNOSIS — C4492 Squamous cell carcinoma of skin, unspecified: Secondary | ICD-10-CM

## 2022-08-11 DIAGNOSIS — D0339 Melanoma in situ of other parts of face: Secondary | ICD-10-CM

## 2022-08-11 DIAGNOSIS — L578 Other skin changes due to chronic exposure to nonionizing radiation: Secondary | ICD-10-CM

## 2022-08-11 DIAGNOSIS — D0361 Melanoma in situ of right upper limb, including shoulder: Secondary | ICD-10-CM | POA: Diagnosis not present

## 2022-08-11 DIAGNOSIS — X32XXXA Exposure to sunlight, initial encounter: Secondary | ICD-10-CM

## 2022-08-11 DIAGNOSIS — C44622 Squamous cell carcinoma of skin of right upper limb, including shoulder: Secondary | ICD-10-CM | POA: Diagnosis not present

## 2022-08-11 DIAGNOSIS — Z79899 Other long term (current) drug therapy: Secondary | ICD-10-CM

## 2022-08-11 DIAGNOSIS — Z7189 Other specified counseling: Secondary | ICD-10-CM

## 2022-08-11 DIAGNOSIS — D492 Neoplasm of unspecified behavior of bone, soft tissue, and skin: Secondary | ICD-10-CM

## 2022-08-11 DIAGNOSIS — W908XXA Exposure to other nonionizing radiation, initial encounter: Secondary | ICD-10-CM

## 2022-08-11 DIAGNOSIS — Z5111 Encounter for antineoplastic chemotherapy: Secondary | ICD-10-CM

## 2022-08-11 HISTORY — DX: Squamous cell carcinoma of skin, unspecified: C44.92

## 2022-08-11 MED ORDER — IMIQUIMOD 5 % EX CREA: TOPICAL_CREAM | Freq: Every day | CUTANEOUS | 2 refills | Status: DC

## 2022-08-11 NOTE — Patient Instructions (Addendum)
Wound Care Instructions  Cleanse wound gently with soap and water once a day then pat dry with clean gauze. Apply a thin coat of Petrolatum (petroleum jelly, "Vaseline") over the wound (unless you have an allergy to this). We recommend that you use a new, sterile tube of Vaseline. Do not pick or remove scabs. Do not remove the yellow or white "healing tissue" from the base of the wound.  Cover the wound with fresh, clean, nonstick gauze and secure with paper tape. You may use Band-Aids in place of gauze and tape if the wound is small enough, but would recommend trimming much of the tape off as there is often too much. Sometimes Band-Aids can irritate the skin.  You should call the office for your biopsy report after 1 week if you have not already been contacted.  If you experience any problems, such as abnormal amounts of bleeding, swelling, significant bruising, significant pain, or evidence of infection, please call the office immediately.  FOR ADULT SURGERY PATIENTS: If you need something for pain relief you may take 1 extra strength Tylenol (acetaminophen) AND 2 Ibuprofen (200mg  each) together every 4 hours as needed for pain. (do not take these if you are allergic to them or if you have a reason you should not take them.) Typically, you may only need pain medication for 1 to 3 days.    Send a Clinical cytogeneticist message picture of each area ( Left cheek, Right bicep, Right tricep) 2 weeks after starting the Imiquimod.   Due to recent changes in healthcare laws, you may see results of your pathology and/or laboratory studies on MyChart before the doctors have had a chance to review them. We understand that in some cases there may be results that are confusing or concerning to you. Please understand that not all results are received at the same time and often the doctors may need to interpret multiple results in order to provide you with the best plan of care or course of treatment. Therefore, we ask that you  please give [place] 2 business days to thoroughly review all your results before contacting the office for clarification. Should we see a critical lab result, you will be contacted sooner.   If You Need Anything After Your Visit  If you have any questions or concerns for your doctor, please call our main line at [phone] and press option 4 to reach your doctor's medical assistant. If no one answers, please leave a voicemail as directed and we will return your call as soon as possible. Messages left after 4 pm will be answered the following business day.   You may also send [place] a message via MyChart. We typically respond to MyChart messages within 1-2 business days.  For prescription refills, please ask your pharmacy to contact our office. Our fax number is [phone].  If you have an urgent issue when the clinic is closed that cannot wait until the next business day, you can page your doctor at the number below.    Please note that while we do our best to be available for urgent issues outside of office hours, we are not available 24/7.   If you have an urgent issue and are unable to reach [place], you may choose to seek medical care at your doctor's office, retail clinic, urgent care center, or emergency room.  If you have a medical emergency, please immediately call 911 or go to the emergency department.  Pager Numbers  - Dr. Gwen Pounds: [phone]  -  Dr. Neale Burly: [phone]  - Dr. Roseanne Reno: [phone]  In the event of inclement weather, please call our main line at [phone] for an update on the status of any delays or closures.  Dermatology Medication Tips: Please keep the boxes that topical medications come in in order to help keep track of the instructions about where and how to use these. Pharmacies typically print the medication instructions only on the boxes and not directly on the medication tubes.   If your medication is too expensive, please contact our office at  [phone] option 4 or send [place] a message through MyChart.   We are unable to tell what your co-pay for medications will be in advance as this is different depending on your insurance coverage. However, we may be able to find a substitute medication at lower cost or fill out paperwork to get insurance to cover a needed medication.   If a prior authorization is required to get your medication covered by your insurance company, please allow [place] 1-2 business days to complete this process.  Drug prices often vary depending on where the prescription is filled and some pharmacies may offer cheaper prices.  The website www.goodrx.com contains coupons for medications through different pharmacies. The prices here do not account for what the cost may be with help from insurance (it may be cheaper with your insurance), but the website can give you the price if you did not use any insurance.  - You can print the associated coupon and take it with your prescription to the pharmacy.  - You may also stop by our office during regular business hours and pick up a GoodRx coupon card.  - If you need your prescription sent electronically to a different pharmacy, notify our office through [facility] or by phone at [phone] option 4.     Si Usted Necesita Algo Despus de Su Visita  Tambin puede enviarnos un mensaje a travs de Clinical cytogeneticist. Por lo general respondemos a los mensajes de MyChart en el transcurso de 1 a 2 das hbiles.  Para renovar recetas, por favor pida a su farmacia que se ponga en contacto con nuestra oficina. Annie Sable de fax es [place] [phone].  Si tiene un asunto urgente cuando la clnica est cerrada y que no puede esperar hasta el siguiente da hbil, puede llamar/localizar a su doctor(a) al nmero que aparece a continuacin.   Por favor, tenga en cuenta que aunque hacemos todo lo posible para estar disponibles para asuntos urgentes fuera del horario de [place], no estamos  disponibles las 24 horas del da, los 7 [address]  [address] de la [place].   Si tiene un problema urgente y no puede comunicarse con nosotros, puede optar por buscar atencin mdica  en el consultorio de su doctor(a), en una clnica privada, en un centro de atencin urgente o en una sala de emergencias.  Si tiene Engineer, drilling, por favor llame inmediatamente al 911 o vaya a la sala de emergencias.  Nmeros de bper  - Dr. Gwen Pounds: [phone]  - Dra. Moye: [phone]  - Dra. Roseanne Reno: [phone]  En caso de inclemencias del [place], por favor llame a Lacy Duverney principal al [phone] para una actualizacin sobre el [place] de cualquier retraso o cierre.  Consejos para la medicacin en dermatologa: Por favor, guarde las cajas en las que vienen los medicamentos de uso tpico para ayudarle a seguir las instrucciones sobre dnde y cmo usarlos. Las farmacias generalmente imprimen las instrucciones del medicamento slo en las cajas y  no directamente en los tubos del medicamento.   Si su medicamento es muy caro, por favor, pngase en contacto con Rolm Gala llamando al [phone] y presione la opcin 4 o envenos un mensaje a travs de Clinical cytogeneticist.   No podemos decirle cul ser su copago por los medicamentos por adelantado ya que esto es diferente dependiendo de la cobertura de su seguro. Sin embargo, es posible que podamos encontrar un medicamento sustituto a Audiological scientist un formulario para que el seguro cubra el medicamento que se considera necesario.   Si se requiere una autorizacin previa para que su compaa de seguros [place] su medicamento, por favor permtanos de 1 a 2 das hbiles para completar [address]  Los precios de los medicamentos varan con frecuencia dependiendo del Environmental consultant de dnde se surte la receta y alguna farmacias pueden ofrecer precios ms baratos.  El sitio web www.goodrx.com tiene cupones para medicamentos de Health and safety inspector. Los precios aqu no  tienen en cuenta lo que podra costar con la ayuda del seguro (puede ser ms barato con su seguro), pero el sitio web puede darle el precio si no utiliz Tourist information centre manager.  - Puede imprimir el cupn correspondiente y llevarlo con su receta a la farmacia.  - Tambin puede pasar por nuestra oficina durante el horario de atencin regular y Education officer, museum una tarjeta de cupones de GoodRx.  - Si necesita que su receta se enve electrnicamente a una farmacia diferente, informe a nuestra oficina a travs de MyChart de [place] o por telfono llamando al [phone] y presione la opcin 4.

## 2022-08-11 NOTE — Progress Notes (Signed)
Follow-Up Visit   Subjective  Connor Mays is a 89 y.o. 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  Patient accompanied by daughter who contributes to history.  The following portions of the chart were reviewed this encounter and updated as appropriate: medications, allergies, medical history  Review of Systems:  No other skin or systemic complaints except as noted in HPI or Assessment and Plan.  Objective  Well appearing patient in no apparent distress; mood and affect are within normal limits. A focused examination was performed of the following areas: Face, R arm Relevant exam findings are noted in the Assessment and Plan.  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  We will now pursue topical chemotherapy with imiquimod.  They understand this may not be curative and he may need further surgical treatment for these if they do not resolve  with distraction already performed and topical chemotherapy. The patient and daughter also understand and agree that this is a more reasonable treatment option that we will cause less morbidity than excision of these large areas on areas that would cause significant deformity.  The patient is also 89 years old and he understand is unlikely even if these were not treated that these malignant melanoma in situ/lentigo maligna lesions would be the cause of his demise.  Neoplasm of skin R dorsum hand  Epidermal / dermal shaving  Lesion diameter (cm):  1.2 Informed consent: discussed and consent obtained   Timeout: patient name, date of birth, surgical site, and procedure verified   Procedure prep:  Patient was prepped and draped in usual sterile fashion Prep type:  Isopropyl alcohol Anesthesia: the lesion was anesthetized in a standard fashion   Anesthetic:  1% lidocaine w/ epinephrine 1-100,000 buffered w/ 8.4% NaHCO3 Instrument used: flexible razor blade   Hemostasis achieved with: pressure, aluminum chloride and electrodesiccation   Outcome: patient tolerated procedure well   Post-procedure details: sterile dressing applied and wound care instructions given   Dressing type: bandage and bacitracin    Destruction of lesion Complexity: extensive   Destruction method: electrodesiccation and curettage   Informed consent: discussed and consent obtained   Timeout:  patient name, date of birth, surgical site, and procedure verified Procedure prep:  Patient was prepped and draped in usual sterile fashion Prep type:  Isopropyl alcohol Anesthesia: the lesion was anesthetized in a standard fashion   Anesthetic:  1% lidocaine w/  epinephrine 1-100,000 buffered w/ 8.4% NaHCO3 Curettage performed in three different directions: Yes   Electrodesiccation performed over the curetted area: Yes   Lesion length (cm):  1.2 Lesion width (cm):  1.2 Margin per side (cm):  0.2 Final wound size (cm):   1.6 Hemostasis achieved with:  pressure, aluminum chloride and electrodesiccation Outcome: patient tolerated procedure well with no complications   Post-procedure details: sterile dressing applied and wound care instructions given   Dressing type: bandage and bacitracin    Specimen 1 - Surgical pathology Differential Diagnosis: D48.5 R/O SCC  Check Margins: yes Hyperkeratotic pap 1.2cm EDC  ACTINIC DAMAGE - chronic, secondary to cumulative UV radiation exposure/sun exposure over time - diffuse scaly erythematous macules with underlying dyspigmentation - Recommend daily broad spectrum sunscreen SPF 30+ to sun-exposed areas, reapply every 2 hours as needed.  - Recommend staying in the shade or wearing long sleeves, sun glasses (UVA+UVB protection) and wide brim hats (4-inch brim around the entire circumference of the hat). - Call for new or changing lesions.  Return in about 2 months (around 10/11/2022) for recheck Melanoma IS, Lentigo Maligna Melanoma IS x 2.  I, Sonya Hupman, RMA, am acting as scribe for [name], MD .  Documentation: I have reviewed the above documentation for accuracy and completeness, and I agree with the above.  [name], MD

## 2022-08-17 ENCOUNTER — Telehealth: Payer: Self-pay

## 2022-08-17 NOTE — Telephone Encounter (Signed)
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

## 2022-08-17 NOTE — Telephone Encounter (Signed)
Reviewed pathology results with daughter, she said patient wasn't clear about the pathology results. She voiced understanding, and had no further questions.

## 2022-08-17 NOTE — Telephone Encounter (Signed)
-----   Message from Deirdre Evener, MD sent at 08/16/2022  8:39 PM EDT ----- Diagnosis Skin , right dorsum hand WELL DIFFERENTIATED SQUAMOUS CELL CARCINOMA, BASE INVOLVED  Cancer = SCC Already treated Recheck next visit

## 2022-08-19 ENCOUNTER — Other Ambulatory Visit: Payer: Self-pay | Admitting: Urology

## 2022-08-19 IMAGING — MR MR LUMBAR SPINE W/O CM
5 series · 31 of 48 positions shown · non-contrast
Comparison: None.

CLINICAL DATA: Left low back pain

EXAM:
MRI LUMBAR SPINE WITHOUT CONTRAST
TECHNIQUE: Multiplanar, multisequence MR imaging of the lumbar spine was
performed. No intravenous contrast was administered.

[Series 5: T2 · sagittal · 4.0mm · 0.81mm/px · 6 of 17 slices shown (1 of 2)]
[im 1/17]
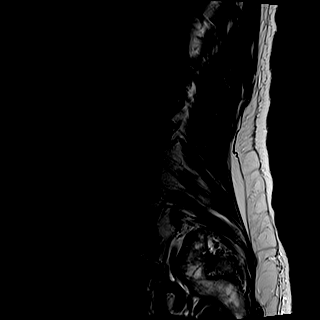
[im 4/17]
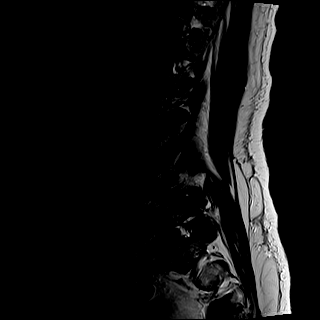
[im 7/17]
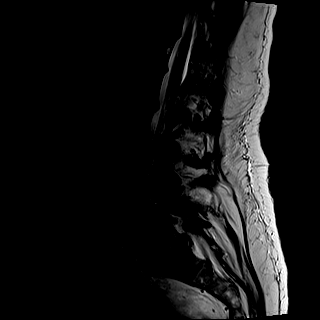
[im 10/17]
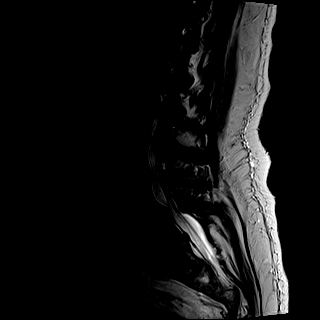
[im 13/17]
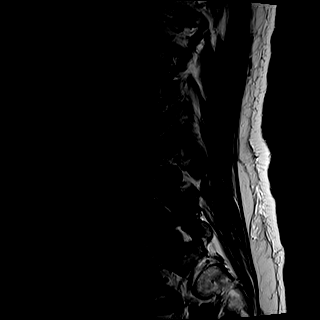
[im 17/17]
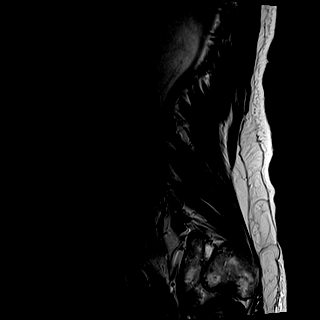

[Series 6: T1 · sagittal · 4.0mm · 0.81mm/px · 6 of 17 slices shown (1 of 2)]
[im 1/17]
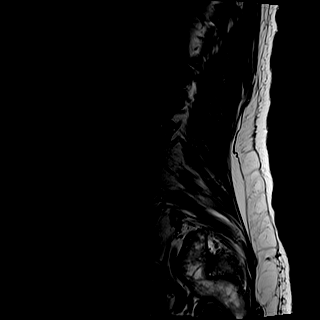
[im 4/17]
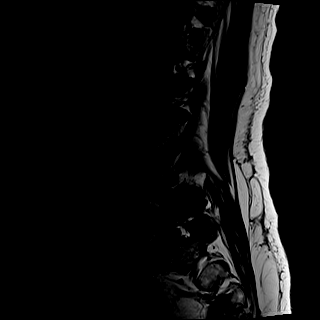
[im 7/17]
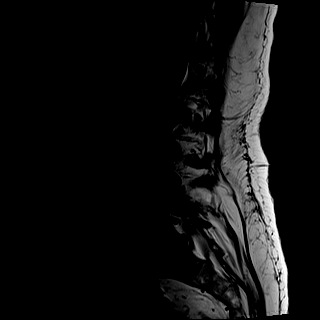
[im 10/17]
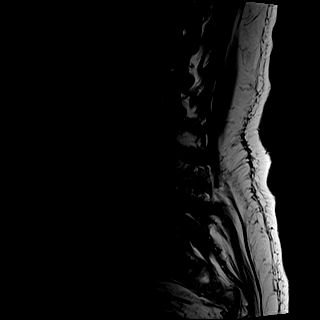
[im 13/17]
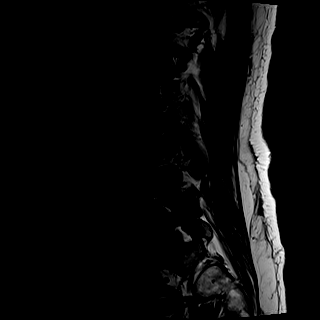
[im 17/17]
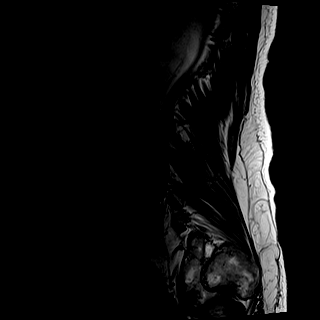

[Series 7: STIR · sagittal · 4.0mm · 0.41mm/px · 1 of 17 slices shown]
[im 1/17]
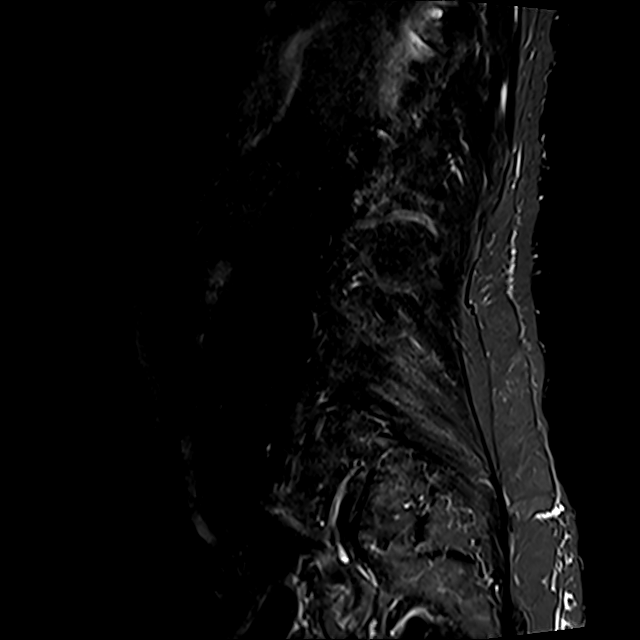

[Series 8: T2 · axial · 4.0mm · 0.78mm/px · z∈[-136,+103]mm · 9 of 39 slices shown (2 of 2)]
[im 1/39]
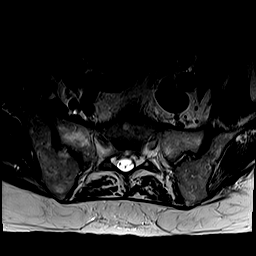
[im 6/39]
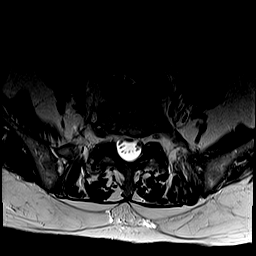
[im 11/39]
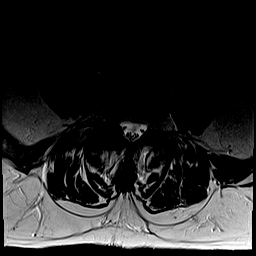
[im 17/39]
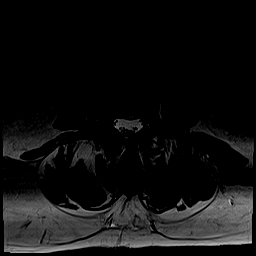
[im 20/39]
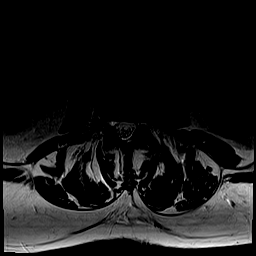
[im 22/39]
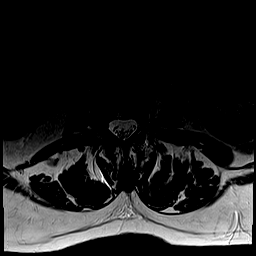
[im 28/39]
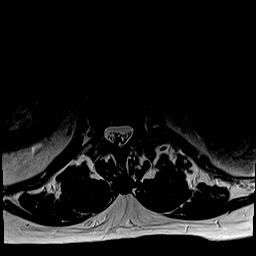
[im 33/39]
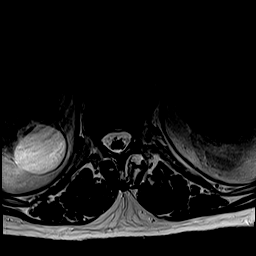
[im 39/39]
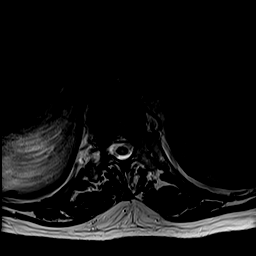

[Series 9: T1 · axial · 4.0mm · 0.39mm/px · z∈[-136,+103]mm · 9 of 39 slices shown (2 of 2)]
[im 1/39]
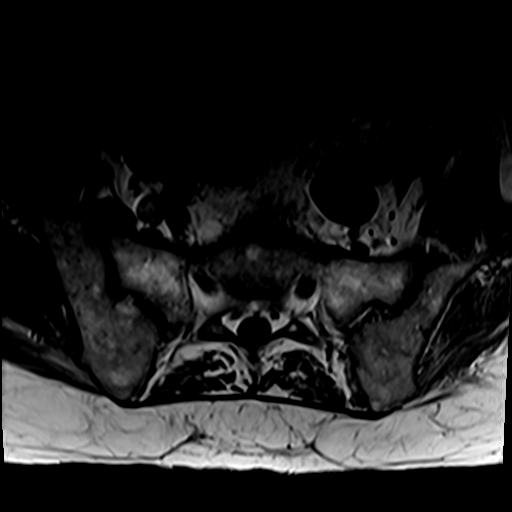
[im 6/39]
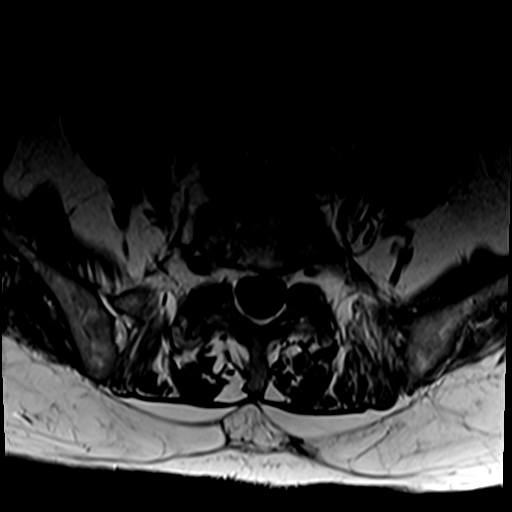
[im 11/39]
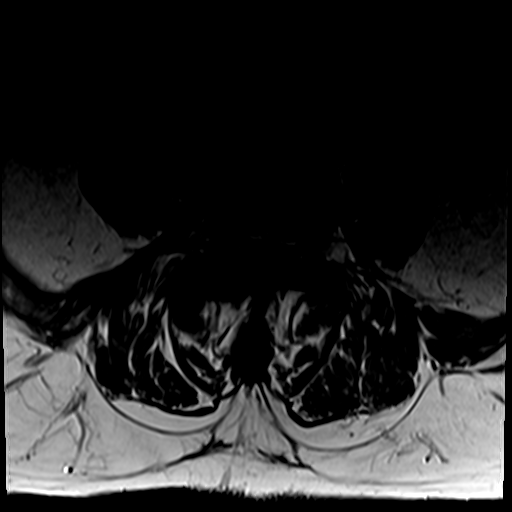
[im 17/39]
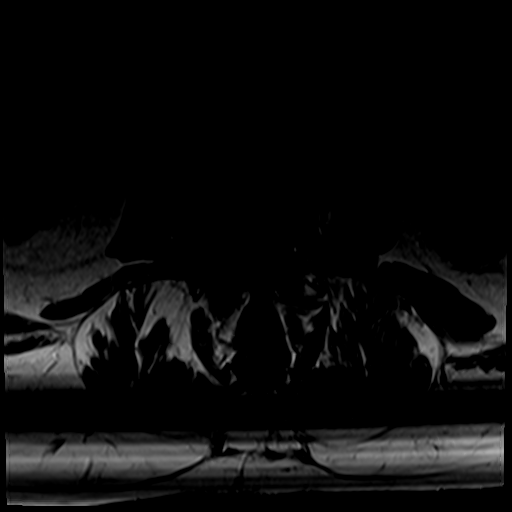
[im 20/39]
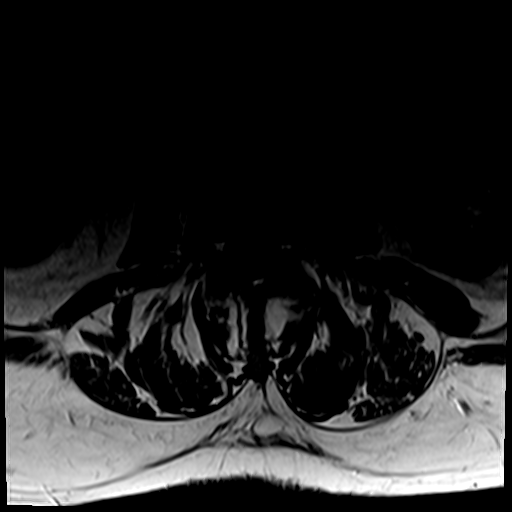
[im 22/39]
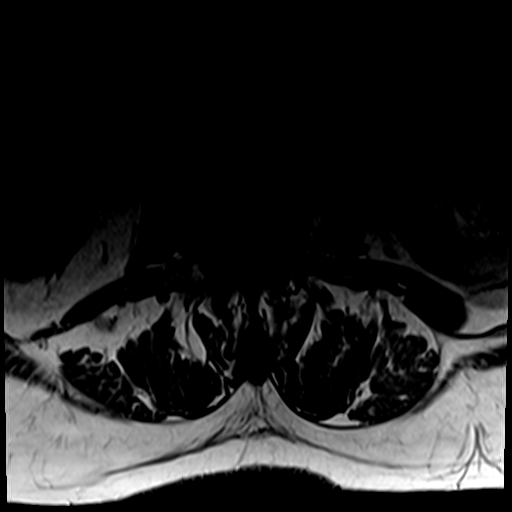
[im 28/39]
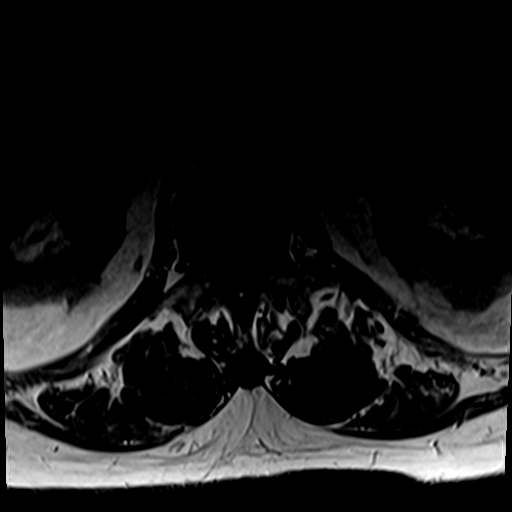
[im 33/39]
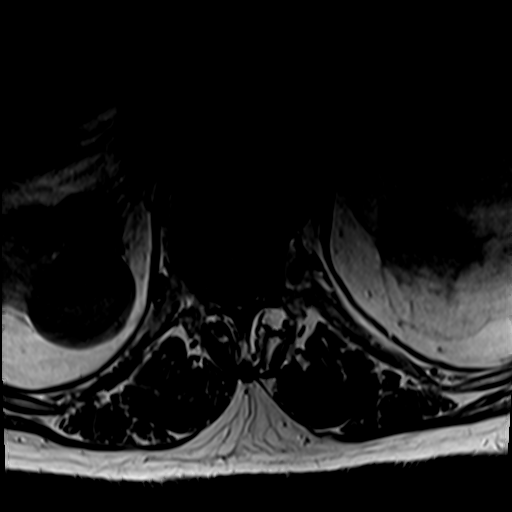
[im 39/39]
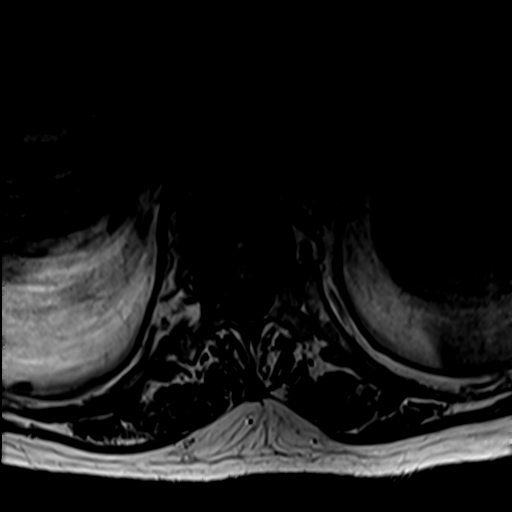

[31 of 48 positions shown; findings below may reference images not displayed]

FINDINGS: Segmentation:  Standard.

Alignment:  Levocurvature.  Grade 1 anterolisthesis at L4-L5.

Vertebrae: Multilevel degenerative endplate irregularity and
Schmorl's nodes. Vertebral body heights are otherwise maintained.
Minor degenerative endplate marrow edema at L3-L4 and L4-L5. There
is likely degenerative marrow edema associated with left L4-L5 facet
arthropathy.

Conus medullaris and cauda equina: Conus extends to the L1 level.
Conus and cauda equina appear normal.

Paraspinal and other soft tissues: Minimal soft tissue edema
adjacent to the right L4-L5 facets. Otherwise unremarkable.

Disc levels: Disc bulges and facet arthropathy are present at lower
thoracic levels. No high-grade canal narrowing. Right foraminal
narrowing is present.

L1-L2:  Disc bulge.  No significant canal or foraminal stenosis.

L2-L3: Disc bulge. No significant canal stenosis. Minor foraminal
stenosis.

L3-L4: Disc bulge. Endplate osteophytic ridging and facet
arthropathy with ligamentum flavum infolding. Mild canal stenosis
with slight effacement of the subarticular recesses. Mild foraminal
stenosis.

L4-L5: Anterolisthesis with uncovering of disc bulge. Marked facet
arthropathy with ligamentum flavum infolding. Moderate to marked
canal stenosis with effacement of subarticular recesses, right
greater than left. Moderate right and mild left foraminal stenosis.

L5-S1: Marked disc height loss. Endplate osteophytes and mild facet
arthropathy. No significant canal or foraminal stenosis.
IMPRESSION: Multilevel degenerative changes as detailed above. No high-grade
stenosis. Marked facet arthropathy at L4-L5.

## 2022-08-24 ENCOUNTER — Encounter: Payer: Self-pay | Admitting: Dermatology

## 2022-08-26 ENCOUNTER — Encounter: Payer: Self-pay | Admitting: Dermatology

## 2022-08-29 ENCOUNTER — Other Ambulatory Visit: Payer: Self-pay

## 2022-08-29 NOTE — Progress Notes (Signed)
Opened in error

## 2022-10-12 ENCOUNTER — Telehealth: Payer: Self-pay

## 2022-10-12 ENCOUNTER — Encounter: Payer: Self-pay | Admitting: Dermatology

## 2022-10-12 ENCOUNTER — Ambulatory Visit: Payer: Medicare HMO | Admitting: Dermatology

## 2022-10-12 VITALS — BP 174/87 | HR 56

## 2022-10-12 DIAGNOSIS — D0361 Melanoma in situ of right upper limb, including shoulder: Secondary | ICD-10-CM

## 2022-10-12 DIAGNOSIS — L578 Other skin changes due to chronic exposure to nonionizing radiation: Secondary | ICD-10-CM

## 2022-10-12 DIAGNOSIS — D0339 Melanoma in situ of other parts of face: Secondary | ICD-10-CM | POA: Diagnosis not present

## 2022-10-12 DIAGNOSIS — Z5111 Encounter for antineoplastic chemotherapy: Secondary | ICD-10-CM

## 2022-10-12 DIAGNOSIS — Z7189 Other specified counseling: Secondary | ICD-10-CM

## 2022-10-12 DIAGNOSIS — W908XXA Exposure to other nonionizing radiation, initial encounter: Secondary | ICD-10-CM

## 2022-10-12 DIAGNOSIS — Z79899 Other long term (current) drug therapy: Secondary | ICD-10-CM

## 2022-10-12 NOTE — Patient Instructions (Addendum)
Restart Imiquimod to the left cheek, right tricep, and right bicep once daily for one month.   Reviewed expected reaction when using imiquimod cream, including irritation and mild inflammation and risk of erosions or more severe inflammation. Reviewed not to apply this in an area larger than 4 x 4 inches to avoid flu-like symptoms. Only a thin layer is required. Reviewed if too much irritation occurs, ensure application of only a thin layer and decrease frequency slightly to achieve a tolerable level of inflammation.   Due to recent changes in healthcare laws, you may see results of your pathology and/or laboratory studies on MyChart before the doctors have had a chance to review them. We understand that in some cases there may be results that are confusing or concerning to you. Please understand that not all results are received at the same time and often the doctors may need to interpret multiple results in order to provide you with the best plan of care or course of treatment. Therefore, we ask that you please give [place] 2 business days to thoroughly review all your results before contacting the office for clarification. Should we see a critical lab result, you will be contacted sooner.   If You Need Anything After Your Visit  If you have any questions or concerns for your doctor, please call our main line at [phone] and press option 4 to reach your doctor's medical assistant. If no one answers, please leave a voicemail as directed and we will return your call as soon as possible. Messages left after 4 pm will be answered the following business day.   You may also send [place] a message via MyChart. We typically respond to MyChart messages within 1-2 business days.  For prescription refills, please ask your pharmacy to contact our office. Our fax number is [phone].  If you have an urgent issue when the clinic is closed that cannot wait until the next business day, you can page your doctor at the  number below.    Please note that while we do our best to be available for urgent issues outside of office hours, we are not available 24/7.   If you have an urgent issue and are unable to reach [place], you may choose to seek medical care at your doctor's office, retail clinic, urgent care center, or emergency room.  If you have a medical emergency, please immediately call 911 or go to the emergency department.  Pager Numbers  - Dr. Gwen Pounds: [phone]  - Dr. Neale Burly: [phone]  - Dr. Roseanne Reno: [phone]  In the event of inclement weather, please call our main line at [phone] for an update on the status of any delays or closures.  Dermatology Medication Tips: Please keep the boxes that topical medications come in in order to help keep track of the instructions about where and how to use these. Pharmacies typically print the medication instructions only on the boxes and not directly on the medication tubes.   If your medication is too expensive, please contact our office at [phone] option 4 or send [place] a message through MyChart.   We are unable to tell what your co-pay for medications will be in advance as this is different depending on your insurance coverage. However, we may be able to find a substitute medication at lower cost or fill out paperwork to get insurance to cover a needed medication.   If a prior authorization is required to get your medication covered by your insurance company, please allow  [place] 1-2 business days to complete this process.  Drug prices often vary depending on where the prescription is filled and some pharmacies may offer cheaper prices.  The website www.goodrx.com contains coupons for medications through different pharmacies. The prices here do not account for what the cost may be with help from insurance (it may be cheaper with your insurance), but the website can give you the price if you did not use any insurance.  - You can print the associated  coupon and take it with your prescription to the pharmacy.  - You may also stop by our office during regular business hours and pick up a GoodRx coupon card.  - If you need your prescription sent electronically to a different pharmacy, notify our office through [facility] or by phone at [phone] option 4.     Si Usted Necesita Algo Despus de Su Visita  Tambin puede enviarnos un mensaje a travs de Clinical cytogeneticist. Por lo general respondemos a los mensajes de MyChart en el transcurso de 1 a 2 das hbiles.  Para renovar recetas, por favor pida a su farmacia que se ponga en contacto con nuestra oficina. Annie Sable de fax es [place] [phone].  Si tiene un asunto urgente cuando la clnica est cerrada y que no puede esperar hasta el siguiente da hbil, puede llamar/localizar a su doctor(a) al nmero que aparece a continuacin.   Por favor, tenga en cuenta que aunque hacemos todo lo posible para estar disponibles para asuntos urgentes fuera del horario de [place], no estamos disponibles las 24 horas del da, los 7 [address]  [address] de la [place].   Si tiene un problema urgente y no puede comunicarse con nosotros, puede optar por buscar atencin mdica  en el consultorio de su doctor(a), en una clnica privada, en un centro de atencin urgente o en una sala de emergencias.  Si tiene Engineer, drilling, por favor llame inmediatamente al 911 o vaya a la sala de emergencias.  Nmeros de bper  - Dr. Gwen Pounds: [phone]  - Dra. Moye: [phone]  - Dra. Roseanne Reno: [phone]  En caso de inclemencias del [place], por favor llame a Lacy Duverney principal al [phone] para una actualizacin sobre el [facility] de cualquier retraso o cierre.  Consejos para la medicacin en dermatologa: Por favor, guarde las cajas en las que vienen los medicamentos de uso tpico para ayudarle a seguir las instrucciones sobre dnde y cmo usarlos. Las farmacias generalmente imprimen las instrucciones del  medicamento slo en las cajas y no directamente en los tubos del [place].   Si su medicamento es muy caro, por favor, pngase en contacto con Rolm Gala llamando al [phone] y presione la opcin 4 o envenos un mensaje a travs de Clinical cytogeneticist.   No podemos decirle cul ser su copago por los medicamentos por adelantado ya que esto es diferente dependiendo de la cobertura de su seguro. Sin embargo, es posible que podamos encontrar un medicamento sustituto a Audiological scientist un formulario para que el seguro cubra el medicamento que se considera necesario.   Si se requiere una autorizacin previa para que su compaa de seguros [place] su medicamento, por favor permtanos de 1 a 2 das hbiles para completar [address]  Los precios de los medicamentos varan con frecuencia dependiendo del Environmental consultant de dnde se surte la receta y alguna farmacias pueden ofrecer precios ms baratos.  El sitio web www.goodrx.com tiene cupones para medicamentos de Health and safety inspector. Los precios aqu no tienen en cuenta lo que podra  costar con la ayuda del seguro (puede ser ms barato con su seguro), pero el sitio web puede darle el precio si no Visual merchandiser.  - Puede imprimir el cupn correspondiente y llevarlo con su receta a la farmacia.  - Tambin puede pasar por nuestra oficina durante el horario de atencin regular y Education officer, museum una tarjeta de cupones de GoodRx.  - Si necesita que su receta se enve electrnicamente a una farmacia diferente, informe a nuestra oficina a travs de MyChart de [place] o por telfono llamando al [phone] y presione la opcin 4.

## 2022-10-12 NOTE — Telephone Encounter (Signed)
Left message on voicemail to return my call so we can discuss patient's most recent visit, and review treatment plan.

## 2022-10-12 NOTE — Progress Notes (Signed)
   Follow-Up Visit   Subjective  Connor Mays is a 89 y.o. male who presents for the following: recheck lentigo maligna sites at the R tricep, R bicep, and L cheek. Patient did use Imiquimod cream in June (see MyChart message), but didn't restart the medication. The patient has spots, moles and lesions to be evaluated, some may be new or changing and the patient may have concern these could be cancer.  The following portions of the chart were reviewed this encounter and updated as appropriate: medications, allergies, medical history  Review of Systems:  No other skin or systemic complaints except as noted in HPI or Assessment and Plan.  Objective  Well appearing patient in no apparent distress; mood and affect are within normal limits. A focused examination Mays performed of the following areas: the face and extremities Relevant exam findings are noted in the Assessment and Plan.  L cheek Erythema   R lat bicep Erythema.  R lat tricep Erythema.   Assessment & Plan   ACTINIC DAMAGE - chronic, secondary to cumulative UV radiation exposure/sun exposure over time - diffuse scaly erythematous macules with underlying dyspigmentation - Recommend daily broad spectrum sunscreen SPF 30+ to sun-exposed areas, reapply every 2 hours as needed.  - Recommend staying in the shade or wearing long sleeves, sun glasses (UVA+UVB protection) and wide brim hats (4-inch brim around the entire circumference of the hat). - Call for new or changing lesions.  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 Restart Imiquimod to aa's QD x 1 month. 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 Restart Imiquimod to aa's QD x 1 month. 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 Restart Imiquimod to aa's QD x 1 month. Consented to communicate with daughter [name] [phone]   Return in about 4 weeks (around 11/09/2022) for MMIS recheck and TBSE.  Connor Mays, CMA, am acting as scribe for Connor Sans, MD .  Documentation: I have reviewed the above documentation for accuracy and completeness, and I agree with the above.  Connor Sans, MD

## 2022-10-13 ENCOUNTER — Telehealth: Payer: Self-pay

## 2022-10-13 NOTE — Telephone Encounter (Signed)
Discussed patient's recent visit with daughter [name] and that he should restart Imiquimod to aa's MMIS QD x 1 mth, and follow up in one month.

## 2022-11-09 ENCOUNTER — Ambulatory Visit: Payer: Medicare HMO | Admitting: Dermatology

## 2022-11-09 DIAGNOSIS — Z79899 Other long term (current) drug therapy: Secondary | ICD-10-CM

## 2022-11-09 DIAGNOSIS — Z5111 Encounter for antineoplastic chemotherapy: Secondary | ICD-10-CM

## 2022-11-09 DIAGNOSIS — Z1283 Encounter for screening for malignant neoplasm of skin: Secondary | ICD-10-CM | POA: Diagnosis not present

## 2022-11-09 DIAGNOSIS — D0361 Melanoma in situ of right upper limb, including shoulder: Secondary | ICD-10-CM | POA: Diagnosis not present

## 2022-11-09 DIAGNOSIS — L820 Inflamed seborrheic keratosis: Secondary | ICD-10-CM

## 2022-11-09 DIAGNOSIS — Z7189 Other specified counseling: Secondary | ICD-10-CM

## 2022-11-09 DIAGNOSIS — L578 Other skin changes due to chronic exposure to nonionizing radiation: Secondary | ICD-10-CM

## 2022-11-09 DIAGNOSIS — D0339 Melanoma in situ of other parts of face: Secondary | ICD-10-CM | POA: Diagnosis not present

## 2022-11-09 DIAGNOSIS — L814 Other melanin hyperpigmentation: Secondary | ICD-10-CM

## 2022-11-09 NOTE — Patient Instructions (Signed)

## 2022-11-09 NOTE — Progress Notes (Signed)
Follow-Up Visit   Subjective  Connor Mays is a 89 y.o. male who presents for the following: Skin Cancer Screening and Full Body Skin Exam  The patient presents for Total-Body Skin Exam (TBSE) for skin cancer screening and mole check. The patient has spots, moles and lesions to be evaluated, some may be new or changing and the patient may have concern these could be cancer.    The following portions of the chart were reviewed this encounter and updated as appropriate: medications, allergies, medical history  Review of Systems:  No other skin or systemic complaints except as noted in HPI or Assessment and Plan.  Objective  Well appearing patient in no apparent distress; mood and affect are within normal limits.  A full examination was performed including scalp, head, eyes, ears, nose, lips, neck, chest, axillae, abdomen, back, buttocks, bilateral upper extremities, bilateral lower extremities, hands, feet, fingers, toes, fingernails, and toenails. All findings within normal limits unless otherwise noted below.   Relevant physical exam findings are noted in the Assessment and Plan.  L cheek Crusted patch.     R lat bicep Crusted patch.     R lat tricep Crusted patch.  L arm x 5 Erythematous stuck-on, waxy papule or plaque    Assessment & Plan   SKIN CANCER SCREENING PERFORMED TODAY.  ACTINIC DAMAGE - Chronic condition, secondary to cumulative UV/sun exposure - diffuse scaly erythematous macules with underlying dyspigmentation - Recommend daily broad spectrum sunscreen SPF 30+ to sun-exposed areas, reapply every 2 hours as needed.  - Staying in the shade or wearing long sleeves, sun glasses (UVA+UVB protection) and wide brim hats (4-inch brim around the entire circumference of the hat) are also recommended for sun protection.  - Call for new or changing lesions.  LENTIGINES, SEBORRHEIC KERATOSES, HEMANGIOMAS - Benign normal skin lesions - Benign-appearing - Call  for any changes  MELANOCYTIC NEVI - Tan-brown and/or pink-flesh-colored symmetric macules and papules - Benign appearing on exam today - Observation - Call clinic for new or changing moles - Recommend daily use of broad spectrum spf 30+ sunscreen to sun-exposed areas.   STASIS DERMATITIS Exam: Erythematous, scaly patches involving the ankle and distal lower leg with associated lower leg edema. Chronic and persistent condition with duration or expected duration over one year. Condition is symptomatic/ bothersome to patient. Not currently at goal. Stasis in the legs causes chronic leg swelling, which may result in itchy or painful rashes, skin discoloration, skin texture changes, and sometimes ulceration.  Recommend daily graduated compression hose/stockings- easiest to put on first thing in morning, remove at bedtime.  Elevate legs as much as possible. Avoid salt/sodium rich foods.  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 Will communicate with patient's daughter Connor Mays pt's treatment plan.   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 Will communicate with patient's daughter Connor Mays pt's treatment plan.   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 Will communicate with patient's daughter Connor Mays pt's treatment plan.   All areas of melanoma in situ show some erythema and crusting.  That pigment has resolved. Counseled we will continue treatment as above and at some point rebiopsy to see if any remains.  Inflamed seborrheic keratosis L arm x 5 Symptomatic, irritating, patient would like treated.  Destruction of lesion - L arm x 5 Complexity: simple   Destruction method: cryotherapy   Informed consent: discussed and consent obtained   Timeout:  patient name, date of  birth, surgical site, and procedure verified Lesion destroyed using liquid nitrogen: Yes   Region frozen until ice ball extended beyond lesion: Yes   Outcome: patient tolerated procedure well with no complications   Post-procedure details: wound care instructions given     Return in about 3 months (around 02/09/2023) for TBSE.  Documentation: I have reviewed the above documentation for accuracy and completeness, and I agree with the above.  [name], MD

## 2022-11-11 ENCOUNTER — Encounter: Payer: Self-pay | Admitting: Dermatology

## 2022-12-08 ENCOUNTER — Ambulatory Visit: Payer: Medicare HMO | Admitting: Dermatology

## 2023-01-27 ENCOUNTER — Ambulatory Visit: Payer: Medicare HMO | Admitting: Physician Assistant

## 2023-01-30 ENCOUNTER — Encounter: Payer: Self-pay | Admitting: Physician Assistant

## 2023-01-30 ENCOUNTER — Ambulatory Visit: Payer: Medicare HMO | Admitting: Physician Assistant

## 2023-01-30 VITALS — BP 120/70 | HR 61 | Ht 70.0 in | Wt 212.0 lb

## 2023-01-30 DIAGNOSIS — R3912 Poor urinary stream: Secondary | ICD-10-CM

## 2023-01-30 DIAGNOSIS — N401 Enlarged prostate with lower urinary tract symptoms: Secondary | ICD-10-CM

## 2023-01-30 LAB — BLADDER SCAN AMB NON-IMAGING: Scan Result: 84

## 2023-01-30 NOTE — Progress Notes (Signed)
01/30/2023 11:33 AM   Connor Mays Ramp Nov 28, 1930 [id number]  CC: Chief Complaint  Patient presents with   Benign Prostatic Hypertrophy   HPI: Connor Mays is a 89 y.o. male with PMH BPH with weak stream on silodosin and finasteride who presents today for 27-month symptom recheck.   Today he reports he stopped silodosin about a month ago and has not noticed a change in his voiding symptoms since.  He remains on finasteride.  He thinks overall his voiding symptoms are stable and continues to describe bothersome weak stream, hesitancy, and occasional urge incontinence, though he admits that the leakage is somewhat improved over prior.  IPSS 14/mostly dissatisfied as below.  PVR 84 mL.   IPSS     Row Name 01/30/23 1000         International Prostate Symptom Score   How often have you had the sensation of not emptying your bladder? About half the time     How often have you had to urinate less than every two hours? Less than half the time     How often have you found you stopped and started again several times when you urinated? Less than 1 in 5 times     How often have you found it difficult to postpone urination? Less than 1 in 5 times     How often have you had a weak urinary stream? Almost always     How often have you had to strain to start urination? Less than 1 in 5 times     How many times did you typically get up at night to urinate? 1 Time     Total IPSS Score 14       Quality of Life due to urinary symptoms   If you were to spend the rest of your life with your urinary condition just the way it is now how would you feel about that? Mostly Disatisfied               PMH: Past Medical History:  Diagnosis Date   Arthritis    lower back   CAD (coronary artery disease)    GERD (gastroesophageal reflux disease)    Heart attack (HCC) 1988   Hyperlipidemia    Hypothyroidism    Malignant melanoma in situ (HCC) 05/23/2022   R lat tricep Lentigo Maligna type  - LN2  06/28/22 Imiquimod start 08/11/22   Malignant melanoma in situ (HCC) 05/23/2022   R lat bicep Lentigo Maligna type  - LN2 06/28/22, Imiquimod start 08/11/22   Malignant melanoma in situ (HCC) 06/28/2022   L cheek - lentigo maligna type, LN2 07/07/22, Imiquimod start 08/11/22   Squamous cell carcinoma of skin 08/11/2022   right dorsum hand, treated with [facility]    Surgical History: Past Surgical History:  Procedure Laterality Date   CATARACT EXTRACTION W/PHACO Right 08/13/2018   Procedure: CATARACT EXTRACTION PHACO AND INTRAOCULAR LENS PLACEMENT (IOC) RIGHT;  Surgeon: Nevada Crane, MD;  Location: [facility];  Service: Ophthalmology;  Laterality: Right;   CATARACT EXTRACTION W/PHACO Left 09/17/2018   Procedure: CATARACT EXTRACTION PHACO AND INTRAOCULAR LENS PLACEMENT (IOC)  LEFT;  Surgeon: Nevada Crane, MD;  Location: [facility];  Service: Ophthalmology;  Laterality: Left;   CORONARY STENT PLACEMENT  2007   heart bypass  1988   HERNIA REPAIR     LEFT HEART CATH AND CORS/GRAFTS ANGIOGRAPHY N/A 05/02/2017   Procedure: LEFT HEART CATH AND CORS/GRAFTS ANGIOGRAPHY;  Surgeon: Juliann Pares,  Bobbie Stack, MD;  Location: [facility] INVASIVE CV LAB;  Service: Cardiovascular;  Laterality: N/A;   TONSILLECTOMY      Home Medications:  Allergies as of 01/30/2023       Reactions   Morphine Other (See Comments)   Pre-syncope        Medication List        Accurate as of January 30, 2023 11:33 AM. If you have any questions, ask your nurse or doctor.          STOP taking these medications    silodosin 8 MG Caps capsule Commonly known as: RAPAFLO Stopped by: Carman Ching       TAKE these medications    carbidopa-levodopa 25-100 MG tablet Commonly known as: SINEMET IR Take by mouth.   carvedilol 6.25 MG tablet Commonly known as: COREG Take by mouth.   clopidogrel 75 MG tablet Commonly known as: PLAVIX Take 75 mg by mouth daily.   finasteride 5 MG  tablet Commonly known as: PROSCAR Take 1 tablet (5 mg total) by mouth daily.   furosemide 20 MG tablet Commonly known as: LASIX Take 20 mg by mouth as needed.   imiquimod 5 % cream Commonly known as: Aldara Apply topically daily. Apply to aa R bicep, R tricep, L cheek   levothyroxine 50 MCG tablet Commonly known as: SYNTHROID Take 50 mcg by mouth daily before breakfast.   losartan 100 MG tablet Commonly known as: COZAAR Take 100 mg by mouth daily.   pramipexole 1 MG tablet Commonly known as: MIRAPEX Take by mouth.   PRESERVISION AREDS PO Take 1 tablet by mouth daily.   simvastatin 40 MG tablet Commonly known as: ZOCOR Take 40 mg by mouth daily.   traMADol 50 MG tablet Commonly known as: ULTRAM Take by mouth every 6 (six) hours as needed.   vitamin C 1000 MG tablet Take 1,000 mg by mouth daily.   Vitamin D3 25 MCG (1000 UT) Caps Take 1,000 Units by mouth daily.        Allergies:  Allergies  Allergen Reactions   Morphine Other (See Comments)    Pre-syncope    Family History: Family History  Problem Relation Age of Onset   Healthy Sister    Bladder Cancer Brother    Healthy Sister     Social History:   reports that he has quit smoking. His smoking use included cigars. He quit smokeless tobacco use about 54 years ago. He reports that he does not drink alcohol and does not use drugs.  Physical Exam: BP 120/70   Pulse 61   Ht 5\' 10"  (1.778 m)   Wt 212 lb (96.2 kg)   BMI 30.42 kg/m   Constitutional:  Alert and oriented, no acute distress, nontoxic appearing HEENT: [place], AT Cardiovascular: No clubbing, cyanosis, or edema Respiratory: Normal respiratory effort, no increased work of breathing Skin: No rashes, bruises or suspicious lesions Neurologic: Grossly intact, no focal deficits, moving all 4 extremities Psychiatric: Normal mood and affect  Laboratory Data: Results for orders placed or performed in visit on 01/30/23  Bladder Scan (Post Void  Residual) in office  Result Value Ref Range   Scan Result 84    Assessment & Plan:   1. Benign prostatic hyperplasia with weak urinary stream Stable, bothersome LUTS now off silodosin.  Will keep him off silodosin since he continues to empty appropriately.  We discussed proceeding with cystoscopy TRUS with consideration of outlet procedures and he is in agreement.  I asked  him to stay on finasteride in the interim.  He is in agreement with this plan. - Bladder Scan (Post Void Residual) in office   Return in about 2 weeks (around 02/13/2023) for Cysto/TRUS with Dr. Lonna Cobb.  Carman Ching, PA-C  [facility] [place] [address], [address] [place], [place] [address] [phone]

## 2023-02-15 ENCOUNTER — Ambulatory Visit: Payer: Medicare HMO | Admitting: Dermatology

## 2023-02-15 DIAGNOSIS — Z1283 Encounter for screening for malignant neoplasm of skin: Secondary | ICD-10-CM

## 2023-02-15 DIAGNOSIS — L814 Other melanin hyperpigmentation: Secondary | ICD-10-CM | POA: Diagnosis not present

## 2023-02-15 DIAGNOSIS — L820 Inflamed seborrheic keratosis: Secondary | ICD-10-CM

## 2023-02-15 DIAGNOSIS — L570 Actinic keratosis: Secondary | ICD-10-CM | POA: Diagnosis not present

## 2023-02-15 DIAGNOSIS — Z85828 Personal history of other malignant neoplasm of skin: Secondary | ICD-10-CM

## 2023-02-15 DIAGNOSIS — L821 Other seborrheic keratosis: Secondary | ICD-10-CM

## 2023-02-15 DIAGNOSIS — Z8589 Personal history of malignant neoplasm of other organs and systems: Secondary | ICD-10-CM

## 2023-02-15 DIAGNOSIS — W908XXA Exposure to other nonionizing radiation, initial encounter: Secondary | ICD-10-CM | POA: Diagnosis not present

## 2023-02-15 DIAGNOSIS — Z85820 Personal history of malignant melanoma of skin: Secondary | ICD-10-CM

## 2023-02-15 DIAGNOSIS — D1801 Hemangioma of skin and subcutaneous tissue: Secondary | ICD-10-CM

## 2023-02-15 DIAGNOSIS — Z86006 Personal history of melanoma in-situ: Secondary | ICD-10-CM

## 2023-02-15 DIAGNOSIS — D229 Melanocytic nevi, unspecified: Secondary | ICD-10-CM

## 2023-02-15 DIAGNOSIS — L578 Other skin changes due to chronic exposure to nonionizing radiation: Secondary | ICD-10-CM | POA: Diagnosis not present

## 2023-02-15 NOTE — Patient Instructions (Signed)

## 2023-02-15 NOTE — Progress Notes (Signed)
Follow-Up Visit   Subjective  Connor Mays is a 89 y.o. male who presents for the following: Skin Cancer Screening and Full Body Skin Exam  The patient presents for Total-Body Skin Exam (TBSE) for skin cancer screening and mole check. The patient has spots, moles and lesions to be evaluated, some may be new or changing and the patient may have concern these could be cancer.  The following portions of the chart were reviewed this encounter and updated as appropriate: medications, allergies, medical history  Review of Systems:  No other skin or systemic complaints except as noted in HPI or Assessment and Plan.  Objective  Well appearing patient in no apparent distress; mood and affect are within normal limits.  A full examination was performed including scalp, head, eyes, ears, nose, lips, neck, chest, axillae, abdomen, back, buttocks, bilateral upper extremities, bilateral lower extremities, hands, feet, fingers, toes, fingernails, and toenails. All findings within normal limits unless otherwise noted below.   Relevant physical exam findings are noted in the Assessment and Plan.  L elbow x 1, L arm x 1, L lat calf x 2 (4) Erythematous stuck-on, waxy papule or plaque  L hand x 5, L ear x 2 (7) Erythematous thin papules/macules with gritty scale.     Assessment & Plan   SKIN CANCER SCREENING PERFORMED TODAY.  ACTINIC DAMAGE - Chronic condition, secondary to cumulative UV/sun exposure - diffuse scaly erythematous macules with underlying dyspigmentation - Recommend daily broad spectrum sunscreen SPF 30+ to sun-exposed areas, reapply every 2 hours as needed.  - Staying in the shade or wearing long sleeves, sun glasses (UVA+UVB protection) and wide brim hats (4-inch brim around the entire circumference of the hat) are also recommended for sun protection.  - Call for new or changing lesions.  LENTIGINES, SEBORRHEIC KERATOSES, HEMANGIOMAS - Benign normal skin lesions -  Benign-appearing - Call for any changes  MELANOCYTIC NEVI - Tan-brown and/or pink-flesh-colored symmetric macules and papules - Benign appearing on exam today - Observation - Call clinic for new or changing moles - Recommend daily use of broad spectrum spf 30+ sunscreen to sun-exposed areas.   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  - Call if any new or changing lesions are noted between office visits  HISTORY OF SQUAMOUS CELL CARCINOMA OF THE SKIN - No evidence of recurrence today - No lymphadenopathy - Recommend regular full body skin exams - Recommend daily broad spectrum sunscreen SPF 30+ to sun-exposed areas, reapply every 2 hours as needed.  - Call if any new or changing lesions are noted between office visits  Inflamed seborrheic keratosis (4) L elbow x 1, L arm x 1, L lat calf x 2  Symptomatic, irritating, patient would like treated.   Destruction of lesion - L elbow x 1, L arm x 1, L lat calf x 2 (4) Complexity: simple   Destruction method: cryotherapy   Informed consent: discussed and consent obtained   Timeout:  patient name, date of birth, surgical site, and procedure verified Lesion destroyed using liquid nitrogen: Yes   Region frozen until ice ball extended beyond lesion: Yes   Outcome: patient tolerated procedure well with no complications   Post-procedure details: wound care instructions given    AK (actinic  keratosis) (7) L hand x 5, L ear x 2  Actinic keratoses are precancerous spots that appear secondary to cumulative UV radiation exposure/sun exposure over time. They are chronic with expected duration over 1 year. A portion of actinic keratoses will progress to squamous cell carcinoma of the skin. It is not possible to  reliably predict which spots will progress to skin cancer and so treatment is recommended to prevent development of skin cancer.  Recommend daily broad spectrum sunscreen SPF 30+ to sun-exposed areas, reapply every 2 hours as needed.  Recommend staying in the shade or wearing long sleeves, sun glasses (UVA+UVB protection) and wide brim hats (4-inch brim around the entire circumference of the hat). Call for new or changing lesions.   Destruction of lesion - L hand x 5, L ear x 2 (7) Complexity: simple   Destruction method: cryotherapy   Informed consent: discussed and consent obtained   Timeout:  patient name, date of birth, surgical site, and procedure verified Lesion destroyed using liquid nitrogen: Yes   Region frozen until ice ball extended beyond lesion: Yes   Outcome: patient tolerated procedure well with no complications   Post-procedure details: wound care instructions given     Return in about 4 months (around 06/16/2023) for TBSE.  [name], CMA, am acting as scribe for [name], MD .   Documentation: I have reviewed the above documentation for accuracy and completeness, and I agree with the above.  [name], MD

## 2023-02-20 ENCOUNTER — Encounter: Payer: Medicare HMO | Admitting: Urology

## 2023-02-21 ENCOUNTER — Encounter: Payer: Self-pay | Admitting: Dermatology

## 2023-04-06 ENCOUNTER — Emergency Department
Admission: EM | Admit: 2023-04-06 | Discharge: 2023-04-06 | Disposition: A | Discharge status: Home / Self Care | Payer: Medicare HMO | Attending: Emergency Medicine | Admitting: Emergency Medicine

## 2023-04-06 ENCOUNTER — Emergency Department: Payer: Medicare HMO

## 2023-04-06 ENCOUNTER — Encounter: Payer: Self-pay | Admitting: Emergency Medicine

## 2023-04-06 ENCOUNTER — Other Ambulatory Visit: Payer: Self-pay

## 2023-04-06 DIAGNOSIS — I2510 Atherosclerotic heart disease of native coronary artery without angina pectoris: Secondary | ICD-10-CM | POA: Diagnosis not present

## 2023-04-06 DIAGNOSIS — K4090 Unilateral inguinal hernia, without obstruction or gangrene, not specified as recurrent: Secondary | ICD-10-CM | POA: Diagnosis not present

## 2023-04-06 DIAGNOSIS — Z856 Personal history of leukemia: Secondary | ICD-10-CM | POA: Insufficient documentation

## 2023-04-06 DIAGNOSIS — R1903 Right lower quadrant abdominal swelling, mass and lump: Secondary | ICD-10-CM | POA: Diagnosis present

## 2023-04-06 LAB — URINALYSIS, ROUTINE W REFLEX MICROSCOPIC
Bilirubin Urine: NEGATIVE
Glucose, UA: NEGATIVE mg/dL
Hgb urine dipstick: NEGATIVE
Ketones, ur: 5 mg/dL — AB
Leukocytes,Ua: NEGATIVE
Nitrite: NEGATIVE
Protein, ur: NEGATIVE mg/dL
Specific Gravity, Urine: 1.025 (ref 1.005–1.030)
pH: 5 (ref 5.0–8.0)

## 2023-04-06 LAB — COMPREHENSIVE METABOLIC PANEL
ALT: 9 U/L (ref 0–44)
AST: 16 U/L (ref 15–41)
Albumin: 4.2 g/dL (ref 3.5–5.0)
Alkaline Phosphatase: 52 U/L (ref 38–126)
Anion gap: 13 (ref 5–15)
BUN: 33 mg/dL — ABNORMAL HIGH (ref 8–23)
CO2: 25 mmol/L (ref 22–32)
Calcium: 9.3 mg/dL (ref 8.9–10.3)
Chloride: 103 mmol/L (ref 98–111)
Creatinine, Ser: 1.18 mg/dL (ref 0.61–1.24)
GFR, Estimated: 59 mL/min — ABNORMAL LOW (ref >60)
Glucose, Bld: 165 mg/dL — ABNORMAL HIGH (ref 70–99)
Potassium: 4.1 mmol/L (ref 3.5–5.1)
Sodium: 141 mmol/L (ref 135–145)
Total Bilirubin: 0.9 mg/dL (ref 0.0–1.2)
Total Protein: 6.8 g/dL (ref 6.5–8.1)

## 2023-04-06 LAB — CBC
HCT: 44.2 % (ref 39.0–52.0)
Hemoglobin: 13.9 g/dL (ref 13.0–17.0)
MCH: 29.3 pg (ref 26.0–34.0)
MCHC: 31.4 g/dL (ref 30.0–36.0)
MCV: 93.2 fL (ref 80.0–100.0)
Platelets: 180 10*3/uL (ref 150–400)
RBC: 4.74 MIL/uL (ref 4.22–5.81)
RDW: 13.8 % (ref 11.5–15.5)
WBC: 15.2 10*3/uL — ABNORMAL HIGH (ref 4.0–10.5)
nRBC: 0 % (ref 0.0–0.2)

## 2023-04-06 LAB — LIPASE, BLOOD: Lipase: 30 U/L (ref 11–51)

## 2023-04-06 NOTE — ED Triage Notes (Signed)
Patient wheeled to triage with complaints of right sided groin pain and bulging that started today. Believes he has a hernia.  Denies abdominal pain or difficulty having bowel movements. No vomiting/fevers.

## 2023-04-06 NOTE — ED Provider Notes (Signed)
[facility] Provider Note    Event Date/Time   First MD Initiated Contact with Patient 04/06/23 2158     (approximate)   History   Groin Pain   HPI  Connor Mays is a 89 y.o. male history of chronic leukemia DVT coronary disease.  Patient reports having previous right groin surgery over 10 years ago with mesh repair for hernia  Over the last couple weeks she has had a couple episodes of pain in his right groin.  Suspicious he had a possible hernia.  Along with that he will get some nausea.  This evening he had fairly severe pain and swelling in his right groin.  He reports he was very nauseated he wanted to vomit but did not.  The area of the right groin along the edge of the testicle was swollen.  He reports the pain was severe but has now started to subside the swelling is started to go down he has about a 4 out of 10 pain and mild nausea at this time  No fevers or chills.     Physical Exam   Triage Vital Signs: ED Triage Vitals  Encounter Vitals Group     BP 04/06/23 1935 (!) 149/70     Systolic BP Percentile --      Diastolic BP Percentile --      Pulse Rate 04/06/23 1935 60     Resp 04/06/23 1935 18     Temp 04/06/23 1935 97.6 F (36.4 C)     Temp Source 04/06/23 1935 Oral     SpO2 04/06/23 1935 98 %     Weight 04/06/23 1940 205 lb (93 kg)     Height 04/06/23 1940 5\' 10"  (1.778 m)     Head Circumference --      Peak Flow --      Pain Score 04/06/23 1940 7     Pain Loc --      Pain Education --      Exclude from Growth Chart --     Most recent vital signs: Vitals:   04/06/23 1935 04/06/23 2238  BP: (!) 149/70 126/70  Pulse: 60 (!) 57  Resp: 18 18  Temp: 97.6 F (36.4 C)   SpO2: 98% 99%     General: Awake, no distress.  Able to ambulate. CV:  Good peripheral perfusion.  Resp:  Normal effort.  Abd:  No distention.  Soft nontender nondistended.  No notable right lower quadrant pain Other:  With the patient standing, he has  tenderness in the right inguinal canal and very slight swelling of the right inguinal region compared to the left.  There is palpable mass that feels like a small hernia sac at the superior aspect of the inguinal canal but demonstrates mild tenderness.  There is no overlying skin change.  He reports the area was quite a bit more swollen earlier and has just mild swelling at this time.  Not obviously reducible   ED Results / Procedures / Treatments   Labs (all labs ordered are listed, but only abnormal results are displayed) Labs Reviewed  COMPREHENSIVE METABOLIC PANEL - Abnormal; Notable for the following components:      Result Value   Glucose, Bld 165 (*)    BUN 33 (*)    GFR, Estimated 59 (*)    All other components within normal limits  CBC - Abnormal; Notable for the following components:   WBC 15.2 (*)    All other  components within normal limits  URINALYSIS, ROUTINE W REFLEX MICROSCOPIC - Abnormal; Notable for the following components:   Color, Urine YELLOW (*)    APPearance CLEAR (*)    Ketones, ur 5 (*)    All other components within normal limits  LIPASE, BLOOD   Labs demonstrate a leukocytosis white count of 15,000.  Comprehensive metabolic panel is normal.  Urinalysis no evidence infection    RADIOLOGY CT ABDOMEN PELVIS WO CONTRAST Result Date: 04/06/2023 CLINICAL DATA:  Right-sided groin pain and bulging that started today. EXAM: CT ABDOMEN AND PELVIS WITHOUT CONTRAST TECHNIQUE: Multidetector CT imaging of the abdomen and pelvis was performed following the standard protocol without IV contrast. RADIATION DOSE REDUCTION: This exam was performed according to the departmental dose-optimization program which includes automated exposure control, adjustment of the mA and/or kV according to patient size and/or use of iterative reconstruction technique. COMPARISON:  None Available. FINDINGS: Lower chest: Loculated pericardial effusion along the inferior aspect of the right heart  measures 8.7 cm and is unchanged from 05/19/2017. No acute abnormality. Hepatobiliary: Hepatic cysts. Gallbladder and biliary tree are unremarkable. Pancreas: Unremarkable. Spleen: Unremarkable. Adrenals/Urinary Tract: Normal adrenal glands. No urinary calculi or hydronephrosis. Unremarkable bladder. Stomach/Bowel: Stomach is within normal limits. Question mild wall thickening with hazy adjacent stranding about a loop of small bowel in the left hemiabdomen (series 2/image 39). Small bowel herniates into the right inguinal hernia. The small bowel upstream from the herniated loop is mildly prominent measuring 2.0 cm. Early or partial obstruction is not excluded. No evidence of strangulation. Normal caliber colon without wall thickening. Normal appendix. Extensive sigmoid diverticulosis without evidence of diverticulitis Vascular/Lymphatic: Aortic atherosclerotic calcification. Retroperitoneal lymph node between the aorta and IVC measuring 1.3 cm (series 2/image 50). No additional enlarged lymph nodes. Reproductive: Enlarged prostate. Other: Misty mesentery with sparing of the perilymph nodal fat in the central mesentery. No fluid collection or abscess. No free intraperitoneal air. Fat containing bilateral inguinal hernias. The right inguinal hernia contains a nonobstructed loop of small bowel. Musculoskeletal: No acute fracture. IMPRESSION: 1. Right inguinal hernia contains a nonobstructed loop of small bowel. The small bowel upstream from the herniated loop is mildly prominent measuring 2.0 cm. Early or partial obstruction is not excluded. 2. Question mild wall thickening with hazy adjacent stranding about a loop of small bowel in the left hemiabdomen. This could be due to enteritis. 3. Misty mesentery with sparing of the perilymph nodal fat in the central mesentery. Findings are nonspecific but can be seen in the setting of mesenteric panniculitis. 4. Enlarged retroperitoneal lymph node between the aorta and IVC  measuring 1.3 cm. Continued attention on follow-up. 5. Loculated pericardial effusion along the inferior aspect of the right heart measures 8.7 cm and is unchanged from 05/19/2017. Aortic Atherosclerosis (ICD10-I70.0). Electronically Signed   By: Minerva Fester M.D.   On: 04/06/2023 21:05       PROCEDURES:  Critical Care performed: No  Procedures   MEDICATIONS ORDERED IN ED: Medications - No data to display   IMPRESSION / MDM / ASSESSMENT AND PLAN / ED COURSE  I reviewed the triage vital signs and the nursing notes.                              Differential diagnosis includes, but is not limited to, acute incarcerated/strangulated or acute symptomatic right inguinal hernia, bowel ischemia, less likely enteritis acute gastroenteritis diverticular, or other cause.  Based on the patient's  clinical exam and history this appears to be an acutely symptomatic right inguinal hernia.  I suspect that his reduced somewhat but still contains a loop of small bowel.  Reports some tenderness and does have small area of palpable mass in the right inguinal canal.  Pain is currently controlled about a 4-10 he does not wish for any pain or nausea medicine at this time which is somewhat reassuring that his symptoms are improved but along with an elevated white count  Patient's presentation is most consistent with acute complicated illness / injury requiring diagnostic workup.   On further review the patient appears to have a chronic leukocytosis I suspect related to his CLL.    ----------------------------------------- 10:14 PM on 04/06/2023 ----------------------------------------- Discussed reviewed clinical history case current examination with Dr. Tonna Boehringer, and advised he will be reviewing patient's workup and CT imaging and plans to provide further recommendation shortly  Patient's pain improving reassuring.  ----------------------------------------- 10:30 PM on  04/06/2023 ----------------------------------------- Patient resting at this time reports mild pain ongoing the right groin region.  He and his daughter updated, and Dr. Tonna Boehringer advises that he will be coming to see the patient for bedside consult shortly.  ----------------------------------------- 10:59 PM on 04/06/2023 ----------------------------------------- Surgery is provided bedside consult advises patient may be discharged with plan for close follow-up he is to call the office tomorrow for which I am providing information.  Hernia fully reducible per Dr. Tonna Boehringer with outpatient follow-up needed. Return precautions and treatment recommendations provided for patient and daughter. Plan close follow-up       FINAL CLINICAL IMPRESSION(S) / ED DIAGNOSES   Final diagnoses:  Right inguinal hernia     Rx / DC Orders   ED Discharge Orders     None        Note:  This document was prepared using Dragon voice recognition software and may include unintentional dictation errors.   Sharyn Creamer, MD 04/06/23 2300

## 2023-04-06 NOTE — Discharge Instructions (Signed)
Please call Dr. Geoffery Lyons office in the morning to schedule a follow-up appointment.  Please return to the ER right away if you start having moderate to severe pain again, being vomiting, feel the hernia has gotten worse, swelling has recurred, or other new concerns arise.

## 2023-04-06 NOTE — ED Provider Triage Note (Addendum)
Emergency Medicine Provider Triage Evaluation Note  Connor Mays , a 89 y.o. male  was evaluated in triage.  Pt complains of right inguinal bulging associated with pain  Review of Systems  Positive:  Negative:   Physical Exam  BP (!) 149/70   Pulse 60   Temp 97.6 F (36.4 C) (Oral)   Resp 18   Ht 5\' 10"  (1.778 m)   Wt 93 kg   SpO2 98%   BMI 29.41 kg/m  Gen:   Awake, no distress   Resp:  Normal effort  MSK:   Moves extremities without difficulty  Other:  Right inguinal : Palpation of protrusion, tender to palpation  Medical Decision Making  Medically screening exam initiated at 7:47 PM.  Appropriate orders placed.  Connor Mays was informed that the remainder of the evaluation will be completed by another provider, this initial triage assessment does not replace that evaluation, and the importance of remaining in the ED until their evaluation is complete.  Patient with possible hernia, ordered ultrasound   Gladys Damme, PA-C 04/06/23 1948    Gladys Damme, PA-C 04/06/23 1949

## 2023-04-07 NOTE — Consult Note (Signed)
Subjective:   CC: Right inguinal hernia  HPI:  Connor Mays is a 89 y.o. male who was referred by [facility] for evaluation of above cc.   Symptoms were first noted 1 days ago. Pain is sharp, intense, since improved.  Associated with noticeable bulge, exacerbated by nothing.  Lump is reducible.     Past Medical History:  has a past medical history of Arthritis, CAD (coronary artery disease), GERD (gastroesophageal reflux disease), Heart attack (HCC) (1988), Hyperlipidemia, Hypothyroidism, Malignant melanoma in situ (HCC) (05/23/2022), Malignant melanoma in situ (HCC) (05/23/2022), Malignant melanoma in situ (HCC) (06/28/2022), and Squamous cell carcinoma of skin (08/11/2022).  Past Surgical History:  Past Surgical History:  Procedure Laterality Date   CATARACT EXTRACTION W/PHACO Right 08/13/2018   Procedure: CATARACT EXTRACTION PHACO AND INTRAOCULAR LENS PLACEMENT (IOC) RIGHT;  Surgeon: Nevada Crane, MD;  Location: [facility];  Service: Ophthalmology;  Laterality: Right;   CATARACT EXTRACTION W/PHACO Left 09/17/2018   Procedure: CATARACT EXTRACTION PHACO AND INTRAOCULAR LENS PLACEMENT (IOC)  LEFT;  Surgeon: Nevada Crane, MD;  Location: [facility];  Service: Ophthalmology;  Laterality: Left;   CORONARY STENT PLACEMENT  2007   heart bypass  1988   HERNIA REPAIR     LEFT HEART CATH AND CORS/GRAFTS ANGIOGRAPHY N/A 05/02/2017   Procedure: LEFT HEART CATH AND CORS/GRAFTS ANGIOGRAPHY;  Surgeon: Alwyn Pea, MD;  Location: [facility] CV [facility];  Service: Cardiovascular;  Laterality: N/A;   TONSILLECTOMY      Family History: family history includes Bladder Cancer in his brother; Healthy in his sister and sister.  Social History:  reports that he has quit smoking. His smoking use included cigars. He quit smokeless tobacco use about 55 years ago. He reports that he does not drink alcohol and does not use drugs.  Current Medications:  Prior to Admission medications    Medication Sig Start Date End Date Taking? Authorizing Provider  Ascorbic Acid (VITAMIN C) 1000 MG tablet Take 1,000 mg by mouth daily.     [provider]  carbidopa-levodopa (SINEMET IR) 25-100 MG tablet Take by mouth. 08/25/20 08/25/21  [provider]  carvedilol (COREG) 6.25 MG tablet Take by mouth. 11/12/20 11/12/21  [provider]  Cholecalciferol (VITAMIN D3) 1000 units CAPS Take 1,000 Units by mouth daily.     [provider]  clopidogrel (PLAVIX) 75 MG tablet Take 75 mg by mouth daily.    [provider]  finasteride (PROSCAR) 5 MG tablet Take 1 tablet (5 mg total) by mouth daily. 07/27/22   Stoioff, Verna Czech, MD  furosemide (LASIX) 20 MG tablet Take 20 mg by mouth as needed.    [provider]  imiquimod (ALDARA) 5 % cream Apply topically daily. Apply to aa R bicep, R tricep, L cheek 08/11/22   Deirdre Evener, MD  levothyroxine (SYNTHROID, LEVOTHROID) 50 MCG tablet Take 50 mcg by mouth daily before breakfast.     [provider]  losartan (COZAAR) 100 MG tablet Take 100 mg by mouth daily.     [provider]  Multiple Vitamins-Minerals (PRESERVISION AREDS PO) Take 1 tablet by mouth daily.     [provider]  pramipexole (MIRAPEX) 1 MG tablet Take by mouth. 09/23/20   [provider]  simvastatin (ZOCOR) 40 MG tablet Take 40 mg by mouth daily.    [provider]  traMADol (ULTRAM) 50 MG tablet Take by mouth every 6 (six) hours as needed.    [provider]    Allergies:  Allergies as of 04/06/2023 - Review Complete 04/06/2023  Allergen Reaction Noted   Morphine Other (See Comments) 12/30/2015    ROS:  General: Denies weight loss, weight gain, fatigue, fevers, chills, and night sweats. Eyes: Denies blurry vision, double vision, eye pain, itchy eyes, and tearing. Ears: Denies hearing loss, earache, and ringing in ears. Nose: Denies sinus pain, congestion, infections, runny nose,  and nosebleeds. Mouth/throat: Denies hoarseness, sore throat, bleeding gums, and difficulty swallowing. Heart: Denies chest pain, palpitations, racing heart, irregular heartbeat, leg pain or swelling, and decreased activity tolerance. Respiratory: Denies breathing difficulty, shortness of breath, wheezing, cough, and sputum. GI: Denies change in appetite, heartburn, nausea, vomiting, constipation, diarrhea, and blood in stool. GU: Denies difficulty urinating, pain with urinating, urgency, frequency, blood in urine. Musculoskeletal: Denies joint stiffness, pain, swelling, muscle weakness. Skin: Denies rash, itching, mass, tumors, sores, and boils Neurologic: Denies headache, fainting, dizziness, seizures, numbness, and tingling. Psychiatric: Denies depression, anxiety, difficulty sleeping, and memory loss. Endocrine: Denies heat or cold intolerance, and increased thirst or urination. Blood/lymph: Denies easy bruising, easy bruising, and swollen glands     Objective:     BP 126/70 (BP Location: Right Arm)   Pulse (!) 57   Temp 97.6 F (36.4 C) (Oral)   Resp 18   Ht 5\' 10"  (1.778 m)   Wt 93 kg   SpO2 99%   BMI 29.41 kg/m   Constitutional :  alert, cooperative, appears stated age, and no distress  Lymphatics/Throat:  no asymmetry, masses, or scars  Respiratory:  clear to auscultation bilaterally  Cardiovascular:  irregularly irregular rhythm  Gastrointestinal: soft, non-tender; bowel sounds normal; no masses,  no organomegaly and right inguinal hernia, reducible, very small, minimal TTP  Musculoskeletal: lying in bed, no obvious difficulty moving upper extremities  Skin: Cool and moist  Psychiatric: Normal affect, non-agitated, not confused       LABS:     Latest Ref Rng & Units 04/06/2023    7:42 PM 02/01/2022   10:35 AM 01/18/2021    9:09 AM  CMP  Glucose 70 - 99 mg/dL 161  096  045   BUN 8 - 23 mg/dL 33  22  22   Creatinine 0.61 - 1.24 mg/dL 4.09  8.11  9.14   Sodium 135  - 145 mmol/L 141  142  141   Potassium 3.5 - 5.1 mmol/L 4.1  4.4  4.2   Chloride 98 - 111 mmol/L 103  112  105   CO2 22 - 32 mmol/L 25  26  28    Calcium 8.9 - 10.3 mg/dL 9.3  8.8  8.8   Total Protein 6.5 - 8.1 g/dL 6.8  6.6  6.8   Total Bilirubin 0.0 - 1.2 mg/dL 0.9  1.1  1.2   Alkaline Phos 38 - 126 U/L 52  53  46   AST 15 - 41 U/L 16  33  23   ALT 0 - 44 U/L 9  16  9        Latest Ref Rng & Units 04/06/2023    7:42 PM 02/01/2022   10:35 AM 01/18/2021    9:09 AM  CBC  WBC 4.0 - 10.5 K/uL 15.2  14.6  12.4   Hemoglobin 13.0 - 17.0 g/dL 78.2  95.6  21.3   Hematocrit 39.0 - 52.0 % 44.2  37.9  41.8   Platelets 150 - 400 K/uL 180  116  133     RADS:  CLINICAL DATA:  Right-sided groin pain and bulging that started today.   EXAM: CT ABDOMEN AND PELVIS WITHOUT CONTRAST   TECHNIQUE: Multidetector CT imaging of the abdomen and pelvis was performed following the standard protocol without IV contrast.   RADIATION DOSE REDUCTION: This exam was performed according to the departmental dose-optimization program which includes automated exposure control, adjustment of the mA and/or kV according to patient size and/or use of iterative reconstruction technique.   COMPARISON:  None Available.   FINDINGS: Lower chest: Loculated pericardial effusion along the inferior aspect of the right heart measures 8.7 cm and is unchanged from 05/19/2017. No acute abnormality.   Hepatobiliary: Hepatic cysts. Gallbladder and biliary tree are unremarkable.   Pancreas: Unremarkable.   Spleen: Unremarkable.   Adrenals/Urinary Tract: Normal adrenal glands. No urinary calculi or hydronephrosis. Unremarkable bladder.   Stomach/Bowel: Stomach is within normal limits. Question mild wall thickening with hazy adjacent stranding about a loop of small bowel in the left hemiabdomen (series 2/image 39). Small bowel herniates into the right inguinal hernia. The small bowel upstream from the herniated loop is  mildly prominent measuring 2.0 cm. Early or partial obstruction is not excluded. No evidence of strangulation. Normal caliber colon without wall thickening. Normal appendix. Extensive sigmoid diverticulosis without evidence of diverticulitis   Vascular/Lymphatic: Aortic atherosclerotic calcification. Retroperitoneal lymph node between the aorta and IVC measuring 1.3 cm (series 2/image 50). No additional enlarged lymph nodes.   Reproductive: Enlarged prostate.   Other: Misty mesentery with sparing of the perilymph nodal fat in the central mesentery. No fluid collection or abscess. No free intraperitoneal air. Fat containing bilateral inguinal hernias. The right inguinal hernia contains a nonobstructed loop of small bowel.   Musculoskeletal: No acute fracture.   IMPRESSION: 1. Right inguinal hernia contains a nonobstructed loop of small bowel. The small bowel upstream from the herniated loop is mildly prominent measuring 2.0 cm. Early or partial obstruction is not excluded. 2. Question mild wall thickening with hazy adjacent stranding about a loop of small bowel in the left hemiabdomen. This could be due to enteritis. 3. Misty mesentery with sparing of the perilymph nodal fat in the central mesentery. Findings are nonspecific but can be seen in the setting of mesenteric panniculitis. 4. Enlarged retroperitoneal lymph node between the aorta and IVC measuring 1.3 cm. Continued attention on follow-up. 5. Loculated pericardial effusion along the inferior aspect of the right heart measures 8.7 cm and is unchanged from 05/19/2017.   Aortic Atherosclerosis (ICD10-I70.0).     Electronically Signed   By: Minerva Fester M.D.   On: 04/06/2023 21:05 Assessment:     Right inguinal hernia very small and reducible.  Due to degree of pain experienced prior to reduction, recommended proceeding with elective repair. Plan:    Discussed the risk of surgery including recurrence, which can  be up to 50% in the case of incisional or complex hernias, possible use of prosthetic materials (mesh) and the increased risk of mesh infxn if used, bleeding, chronic pain, post-op infxn, post-op SBO or ileus, and possible re-operation to address said risks. The risks of general anesthetic, if used, includes MI, CVA, sudden death or even reaction to anesthetic medications also discussed. Alternatives include continued observation.  Benefits include possible symptom relief, prevention of incarceration, strangulation, enlargement in size over time, and the risk of emergency surgery in the face of strangulation.  Typical post-op recovery time of 3-5 days with 4-6 weeks of activity restrictions were also discussed.  The patient verbalized understanding  and all questions were answered to the patient's satisfaction.  Patient will call office tomorrow to schedule the surgery.  ED return precautions discussed.  Okay to travel in the meantime  labs/images/medications/previous chart entries reviewed personally and relevant changes/updates noted above.

## 2023-04-18 ENCOUNTER — Other Ambulatory Visit: Payer: Medicare HMO

## 2023-04-21 ENCOUNTER — Other Ambulatory Visit: Payer: Self-pay

## 2023-04-21 ENCOUNTER — Ambulatory Visit: Payer: Self-pay | Admitting: Surgery

## 2023-04-21 ENCOUNTER — Encounter
Admission: RE | Admit: 2023-04-21 | Discharge: 2023-04-21 | Disposition: A | Discharge status: Home / Self Care | Payer: Medicare HMO | Source: Ambulatory Visit | Attending: Surgery

## 2023-04-21 DIAGNOSIS — Z01810 Encounter for preprocedural cardiovascular examination: Secondary | ICD-10-CM | POA: Diagnosis not present

## 2023-04-21 DIAGNOSIS — I4892 Unspecified atrial flutter: Secondary | ICD-10-CM | POA: Diagnosis not present

## 2023-04-21 DIAGNOSIS — I119 Hypertensive heart disease without heart failure: Secondary | ICD-10-CM | POA: Diagnosis not present

## 2023-04-21 DIAGNOSIS — I4430 Unspecified atrioventricular block: Secondary | ICD-10-CM | POA: Diagnosis not present

## 2023-04-21 DIAGNOSIS — I10 Essential (primary) hypertension: Secondary | ICD-10-CM

## 2023-04-21 DIAGNOSIS — I4510 Unspecified right bundle-branch block: Secondary | ICD-10-CM | POA: Insufficient documentation

## 2023-04-21 HISTORY — DX: Acute embolism and thrombosis of right peroneal vein: I82.451

## 2023-04-21 HISTORY — DX: Essential (primary) hypertension: I10

## 2023-04-21 HISTORY — DX: Presence of aortocoronary bypass graft: Z95.1

## 2023-04-21 HISTORY — DX: Atherosclerotic heart disease of native coronary artery without angina pectoris: I25.10

## 2023-04-21 HISTORY — DX: Prediabetes: R73.03

## 2023-04-21 HISTORY — DX: Obstructive sleep apnea (adult) (pediatric): G47.33

## 2023-04-21 HISTORY — DX: Unilateral inguinal hernia, without obstruction or gangrene, not specified as recurrent: K40.90

## 2023-04-21 HISTORY — DX: Dyspnea, unspecified: R06.00

## 2023-04-21 NOTE — H&P (Signed)
 Subjective:    CC: Right inguinal hernia   HPI: Subjective Connor Mays is a 89 y.o. male who was referred by [facility] for evaluation of above cc.   Symptoms were first noted 1 days ago. Pain is sharp, intense, since improved.  Associated with noticeable bulge, exacerbated by nothing.  Lump is reducible.      Past Medical History:  has a past medical history of Arthritis, CAD (coronary artery disease), GERD (gastroesophageal reflux disease), Heart attack (HCC) (1988), Hyperlipidemia, Hypothyroidism, Malignant melanoma in situ (HCC) (05/23/2022), Malignant melanoma in situ (HCC) (05/23/2022), Malignant melanoma in situ (HCC) (06/28/2022), and Squamous cell carcinoma of skin (08/11/2022).   Past Surgical History:       Past Surgical History:  Procedure Laterality Date   CATARACT EXTRACTION W/PHACO Right 08/13/2018    Procedure: CATARACT EXTRACTION PHACO AND INTRAOCULAR LENS PLACEMENT (IOC) RIGHT;  Surgeon: Myrna Adine Anes, MD;  Location: [facility];  Service: Ophthalmology;  Laterality: Right;   CATARACT EXTRACTION W/PHACO Left 09/17/2018    Procedure: CATARACT EXTRACTION PHACO AND INTRAOCULAR LENS PLACEMENT (IOC)  LEFT;  Surgeon: Myrna Adine Anes, MD;  Location: [facility];  Service: Ophthalmology;  Laterality: Left;   CORONARY STENT PLACEMENT   2007   heart bypass   1988   HERNIA REPAIR       LEFT HEART CATH AND CORS/GRAFTS ANGIOGRAPHY N/A 05/02/2017    Procedure: LEFT HEART CATH AND CORS/GRAFTS ANGIOGRAPHY;  Surgeon: Florencio Cara BIRCH, MD;  Location: [facility];  Service: Cardiovascular;  Laterality: N/A;   TONSILLECTOMY              Family History: family history includes Bladder Cancer in his brother; Healthy in his sister and sister.   Social History:  reports that he has quit smoking. His smoking use included cigars. He quit smokeless tobacco use about 55 years ago. He reports that he does not drink alcohol and does not use drugs.   Current Medications:          Prior to Admission medications   Medication Sig Start Date End Date Taking? Authorizing Provider  Ascorbic Acid (VITAMIN C) 1000 MG tablet Take 1,000 mg by mouth daily.        [provider]  carbidopa-levodopa (SINEMET IR) 25-100 MG tablet Take by mouth. 08/25/20 08/25/21   [provider]  carvedilol (COREG) 6.25 MG tablet Take by mouth. 11/12/20 11/12/21   [provider]  Cholecalciferol (VITAMIN D3) 1000 units CAPS Take 1,000 Units by mouth daily.        [provider]  clopidogrel (PLAVIX) 75 MG tablet Take 75 mg by mouth daily.       [provider]  finasteride  (PROSCAR ) 5 MG tablet Take 1 tablet (5 mg total) by mouth daily. 07/27/22     Stoioff, Glendia BROCKS, MD  furosemide (LASIX) 20 MG tablet Take 20 mg by mouth as needed.       [provider]  imiquimod  (ALDARA ) 5 % cream Apply topically daily. Apply to aa R bicep, R tricep, L cheek 08/11/22     Hester Alm BROCKS, MD  levothyroxine (SYNTHROID, LEVOTHROID) 50 MCG tablet Take 50 mcg by mouth daily before breakfast.        [provider]  losartan (COZAAR) 100 MG tablet Take 100 mg by mouth daily.        [provider]  Multiple Vitamins-Minerals (PRESERVISION AREDS PO) Take 1 tablet by mouth daily.  [provider]  pramipexole (MIRAPEX) 1 MG tablet Take by mouth. 09/23/20     [provider]  simvastatin (ZOCOR) 40 MG tablet Take 40 mg by mouth daily.       [provider]  traMADol  (ULTRAM ) 50 MG tablet Take by mouth every 6 (six) hours as needed.       [provider]      Allergies:       Allergies as of 04/06/2023 - Review Complete 04/06/2023  Allergen Reaction Noted   Morphine Other (See Comments) 12/30/2015      ROS:  General: Denies weight loss, weight gain, fatigue, fevers, chills, and night sweats. Eyes: Denies blurry vision, double vision, eye pain, itchy eyes, and tearing. Ears: Denies hearing loss,  earache, and ringing in ears. Nose: Denies sinus pain, congestion, infections, runny nose, and nosebleeds. Mouth/throat: Denies hoarseness, sore throat, bleeding gums, and difficulty swallowing. Heart: Denies chest pain, palpitations, racing heart, irregular heartbeat, leg pain or swelling, and decreased activity tolerance. Respiratory: Denies breathing difficulty, shortness of breath, wheezing, cough, and sputum. GI: Denies change in appetite, heartburn, nausea, vomiting, constipation, diarrhea, and blood in stool. GU: Denies difficulty urinating, pain with urinating, urgency, frequency, blood in urine. Musculoskeletal: Denies joint stiffness, pain, swelling, muscle weakness. Skin: Denies rash, itching, mass, tumors, sores, and boils Neurologic: Denies headache, fainting, dizziness, seizures, numbness, and tingling. Psychiatric: Denies depression, anxiety, difficulty sleeping, and memory loss. Endocrine: Denies heat or cold intolerance, and increased thirst or urination. Blood/lymph: Denies easy bruising, easy bruising, and swollen glands       Objective:    Objective BP 126/70 (BP Location: Right Arm)   Pulse (!) 57   Temp 97.6 F (36.4 C) (Oral)   Resp 18   Ht 5' 10 (1.778 m)   Wt 93 kg   SpO2 99%   BMI 29.41 kg/m    Constitutional :  alert, cooperative, appears stated age, and no distress  Lymphatics/Throat:  no asymmetry, masses, or scars  Respiratory:  clear to auscultation bilaterally  Cardiovascular:  irregularly irregular rhythm  Gastrointestinal: soft, non-tender; bowel sounds normal; no masses,  no organomegaly and right inguinal hernia, reducible, very small, minimal TTP  Musculoskeletal: lying in bed, no obvious difficulty moving upper extremities  Skin: Cool and moist  Psychiatric: Normal affect, non-agitated, not confused         LABS:      Latest Ref Rng & Units 04/06/2023    7:42 PM 02/01/2022   10:35 AM 01/18/2021    9:09 AM  CMP  Glucose 70 - 99 mg/dL  834  869  882   BUN 8 - 23 mg/dL 33  22  22   Creatinine 0.61 - 1.24 mg/dL 8.81  8.89  8.95   Sodium 135 - 145 mmol/L 141  142  141   Potassium 3.5 - 5.1 mmol/L 4.1  4.4  4.2   Chloride 98 - 111 mmol/L 103  112  105   CO2 22 - 32 mmol/L 25  26  28    Calcium 8.9 - 10.3 mg/dL 9.3  8.8  8.8   Total Protein 6.5 - 8.1 g/dL 6.8  6.6  6.8   Total Bilirubin 0.0 - 1.2 mg/dL 0.9  1.1  1.2   Alkaline Phos 38 - 126 U/L 52  53  46   AST 15 - 41 U/L 16  33  23   ALT 0 - 44 U/L 9  16  9  Latest Ref Rng & Units 04/06/2023    7:42 PM 02/01/2022   10:35 AM 01/18/2021    9:09 AM  CBC  WBC 4.0 - 10.5 K/uL 15.2  14.6  12.4   Hemoglobin 13.0 - 17.0 g/dL 86.0  87.9  86.6   Hematocrit 39.0 - 52.0 % 44.2  37.9  41.8   Platelets 150 - 400 K/uL 180  116  133       RADS: CLINICAL DATA:  Right-sided groin pain and bulging that started today.   EXAM: CT ABDOMEN AND PELVIS WITHOUT CONTRAST   TECHNIQUE: Multidetector CT imaging of the abdomen and pelvis was performed following the standard protocol without IV contrast.   RADIATION DOSE REDUCTION: This exam was performed according to the departmental dose-optimization program which includes automated exposure control, adjustment of the mA and/or kV according to patient size and/or use of iterative reconstruction technique.   COMPARISON:  None Available.   FINDINGS: Lower chest: Loculated pericardial effusion along the inferior aspect of the right heart measures 8.7 cm and is unchanged from 05/19/2017. No acute abnormality.   Hepatobiliary: Hepatic cysts. Gallbladder and biliary tree are unremarkable.   Pancreas: Unremarkable.   Spleen: Unremarkable.   Adrenals/Urinary Tract: Normal adrenal glands. No urinary calculi or hydronephrosis. Unremarkable bladder.   Stomach/Bowel: Stomach is within normal limits. Question mild wall thickening with hazy adjacent stranding about a loop of small bowel in the left hemiabdomen (series 2/image  39). Small bowel herniates into the right inguinal hernia. The small bowel upstream from the herniated loop is mildly prominent measuring 2.0 cm. Early or partial obstruction is not excluded. No evidence of strangulation. Normal caliber colon without wall thickening. Normal appendix. Extensive sigmoid diverticulosis without evidence of diverticulitis   Vascular/Lymphatic: Aortic atherosclerotic calcification. Retroperitoneal lymph node between the aorta and IVC measuring 1.3 cm (series 2/image 50). No additional enlarged lymph nodes.   Reproductive: Enlarged prostate.   Other: Misty mesentery with sparing of the perilymph nodal fat in the central mesentery. No fluid collection or abscess. No free intraperitoneal air. Fat containing bilateral inguinal hernias. The right inguinal hernia contains a nonobstructed loop of small bowel.   Musculoskeletal: No acute fracture.   IMPRESSION: 1. Right inguinal hernia contains a nonobstructed loop of small bowel. The small bowel upstream from the herniated loop is mildly prominent measuring 2.0 cm. Early or partial obstruction is not excluded. 2. Question mild wall thickening with hazy adjacent stranding about a loop of small bowel in the left hemiabdomen. This could be due to enteritis. 3. Misty mesentery with sparing of the perilymph nodal fat in the central mesentery. Findings are nonspecific but can be seen in the setting of mesenteric panniculitis. 4. Enlarged retroperitoneal lymph node between the aorta and IVC measuring 1.3 cm. Continued attention on follow-up. 5. Loculated pericardial effusion along the inferior aspect of the right heart measures 8.7 cm and is unchanged from 05/19/2017.   Aortic Atherosclerosis (ICD10-I70.0).     Electronically Signed   By: Norman Gatlin M.D.   On: 04/06/2023 21:05 Assessment:        Right inguinal hernia very small and reducible.  Due to degree of pain experienced prior to reduction,  recommended proceeding with elective repair. Plan:      Discussed the risk of surgery including recurrence, which can be up to 50% in the case of incisional or complex hernias, possible use of prosthetic materials (mesh) and the increased risk of mesh infxn if used, bleeding, chronic pain, post-op infxn,  post-op SBO or ileus, and possible re-operation to address said risks. The risks of general anesthetic, if used, includes MI, CVA, sudden death or even reaction to anesthetic medications also discussed. Alternatives include continued observation.  Benefits include possible symptom relief, prevention of incarceration, strangulation, enlargement in size over time, and the risk of emergency surgery in the face of strangulation.  Typical post-op recovery time of 3-5 days with 4-6 weeks of activity restrictions were also discussed.   The patient verbalized understanding and all questions were answered to the patient's satisfaction.   Patient will call office tomorrow to schedule the surgery.  ED return precautions discussed.  Okay to travel in the meantime Open repair due to history of laparoscopic repair in the past  labs/images/medications/previous chart entries reviewed personally and relevant changes/updates noted above.

## 2023-04-21 NOTE — Patient Instructions (Addendum)
 Your procedure is scheduled on: 04/28/23 - Friday Report to the Registration Desk on the 1st floor of the [facility]. To find out your arrival time, please call [phone] between 1PM - 3PM on: 04/27/23 - Thursday If your arrival time is 6:00 am, do not arrive before that time as the [facility] entrance doors do not open until 6:00 am.  REMEMBER: Instructions that are not followed completely may result in serious medical risk, up to and including death; or upon the discretion of your surgeon and anesthesiologist your surgery may need to be rescheduled.  Do not eat food after midnight the night before surgery.  No gum chewing or hard candies.  You may however, drink CLEAR liquids up to 2 hours before you are scheduled to arrive for your surgery. Do not drink anything within 2 hours of your scheduled arrival time.  Clear liquids include: - water  - apple juice without pulp - gatorade (not RED colors) - black coffee or tea (Do NOT add milk or creamers to the coffee or tea) Do NOT drink anything that is not on this list.   One week prior to surgery: Stop Anti-inflammatories (NSAIDS) such as Advil, Aleve, Ibuprofen, Motrin, Naproxen, Naprosyn and Aspirin  based products such as Excedrin, Goody's Powder, BC Powder. You may take Tylenol  if needed for pain up until the day of surgery.  Stop ANY OVER THE COUNTER supplements until after surgery. VITAMIN C ,VITAMIN D3 ,PRESERVISION AREDS . MAGNESIUM .   HOLD XARELTO 5 days beginning 04/23/23 - Sunday.  ON THE DAY OF SURGERY ONLY TAKE THESE MEDICATIONS WITH SIPS OF WATER:  carbidopa-levodopa (SINEMET IR)  carvedilol (COREG ) levothyroxine (SYNTHROID  pramipexole (MIRAPEX    No Alcohol for 24 hours before or after surgery.  No Smoking including e-cigarettes for 24 hours before surgery.  No chewable tobacco products for at least 6 hours before surgery.  No nicotine patches on the day of surgery.  Do not use any recreational drugs  for at least a week (preferably 2 weeks) before your surgery.  Please be advised that the combination of cocaine and anesthesia may have negative outcomes, up to and including death. If you test positive for cocaine, your surgery will be cancelled.  On the morning of surgery brush your teeth with toothpaste and water, you may rinse your mouth with mouthwash if you wish. Do not swallow any toothpaste or mouthwash.  Use CHG Soap or wipes as directed on instruction sheet.  Do not wear jewelry, make-up, hairpins, clips or nail polish.  For welded (permanent) jewelry: bracelets, anklets, waist bands, etc.  Please have this removed prior to surgery.  If it is not removed, there is a chance that hospital personnel will need to cut it off on the day of surgery.  Do not wear lotions, powders, or perfumes.   Do not shave body hair from the neck down 48 hours before surgery.  Contact lenses, hearing aids and dentures may not be worn into surgery.  Do not bring valuables to the hospital. [facility] is not responsible for any missing/lost belongings or valuables.   Notify your doctor if there is any change in your medical condition (cold, fever, infection).  Wear comfortable clothing (specific to your surgery type) to the hospital.  After surgery, you can help prevent lung complications by doing breathing exercises.  Take deep breaths and cough every 1-2 hours. Your doctor may order a device called an Incentive Spirometer to help you take deep breaths.  When coughing or sneezing, hold a pillow firmly against your incision with both hands. This is called "splinting." Doing this helps protect your incision. It also decreases belly discomfort.  If you are being admitted to the hospital overnight, leave your suitcase in the car. After surgery it may be brought to your room.  In case of increased patient census, it may be necessary for you, the patient, to continue your postoperative care in the Same  Day Surgery department.  If you are being discharged the day of surgery, you will not be allowed to drive home. You will need a responsible individual to drive you home and stay with you for 24 hours after surgery.   If you are taking public transportation, you will need to have a responsible individual with you.  Please call the Pre-admissions Testing Dept. at [phone] if you have any questions about these instructions.  Surgery Visitation Policy:  Patients having surgery or a procedure may have two visitors.  Children under the age of 33 must have an adult with them who is not the patient.  Temporary Visitor Restrictions Due to increasing cases of flu, RSV and COVID-19: Children ages 4 and under will not be able to visit patients in [facility] hospitals under most circumstances.  Inpatient Visitation:    Visiting hours are 7 a.m. to 8 p.m. Up to four visitors are allowed at one time in a patient room. The visitors may rotate out with other people during the day.  One visitor age 68 or older may stay with the patient overnight and must be in the room by 8 p.m.    Preparing for Surgery with CHLORHEXIDINE  GLUCONATE (CHG) Soap  Chlorhexidine  Gluconate (CHG) Soap  o An antiseptic cleaner that kills germs and bonds with the skin to continue killing germs even after washing  o Used for showering the night before surgery and morning of surgery  Before surgery, you can play an important role by reducing the number of germs on your skin.  CHG (Chlorhexidine  gluconate) soap is an antiseptic cleanser which kills germs and bonds with the skin to continue killing germs even after washing.  Please do not use if you have an allergy to CHG or antibacterial soaps. If your skin becomes reddened/irritated stop using the CHG.  1. Shower the NIGHT BEFORE SURGERY and the MORNING OF SURGERY with CHG soap.  2. If you choose to wash your hair, wash your hair first as usual with your normal  shampoo.  3. After shampooing, rinse your hair and body thoroughly to remove the shampoo.  4. Use CHG as you would any other liquid soap. You can apply CHG directly to the skin and wash gently with a scrungie or a clean washcloth.  5. Apply the CHG soap to your body only from the neck down. Do not use on open wounds or open sores. Avoid contact with your eyes, ears, mouth, and genitals (private parts). Wash face and genitals (private parts) with your normal soap.  6. Wash thoroughly, paying special attention to the area where your surgery will be performed.  7. Thoroughly rinse your body with warm water.  8. Do not shower/wash with your normal soap after using and rinsing off the CHG soap.  9. Pat yourself dry with a clean towel.  10. Wear clean pajamas to bed the night before surgery.  12. Place clean sheets on your bed the night of your first shower and do not sleep with pets.  13.  Shower again with the CHG soap on the day of surgery prior to arriving at the hospital.  14. Do not apply any deodorants/lotions/powders.  15. Please wear clean clothes to the hospital.

## 2023-04-21 NOTE — H&P (View-Only) (Signed)
 Subjective:    CC: Right inguinal hernia   HPI: Subjective Connor Mays is a 89 y.o. male who was referred by [facility] for evaluation of above cc.   Symptoms were first noted 1 days ago. Pain is sharp, intense, since improved.  Associated with noticeable bulge, exacerbated by nothing.  Lump is reducible.      Past Medical History:  has a past medical history of Arthritis, CAD (coronary artery disease), GERD (gastroesophageal reflux disease), Heart attack (HCC) (1988), Hyperlipidemia, Hypothyroidism, Malignant melanoma in situ (HCC) (05/23/2022), Malignant melanoma in situ (HCC) (05/23/2022), Malignant melanoma in situ (HCC) (06/28/2022), and Squamous cell carcinoma of skin (08/11/2022).   Past Surgical History:       Past Surgical History:  Procedure Laterality Date   CATARACT EXTRACTION W/PHACO Right 08/13/2018    Procedure: CATARACT EXTRACTION PHACO AND INTRAOCULAR LENS PLACEMENT (IOC) RIGHT;  Surgeon: Myrna Adine Anes, MD;  Location: [facility];  Service: Ophthalmology;  Laterality: Right;   CATARACT EXTRACTION W/PHACO Left 09/17/2018    Procedure: CATARACT EXTRACTION PHACO AND INTRAOCULAR LENS PLACEMENT (IOC)  LEFT;  Surgeon: Myrna Adine Anes, MD;  Location: [facility];  Service: Ophthalmology;  Laterality: Left;   CORONARY STENT PLACEMENT   2007   heart bypass   1988   HERNIA REPAIR       LEFT HEART CATH AND CORS/GRAFTS ANGIOGRAPHY N/A 05/02/2017    Procedure: LEFT HEART CATH AND CORS/GRAFTS ANGIOGRAPHY;  Surgeon: Florencio Cara BIRCH, MD;  Location: [facility];  Service: Cardiovascular;  Laterality: N/A;   TONSILLECTOMY              Family History: family history includes Bladder Cancer in his brother; Healthy in his sister and sister.   Social History:  reports that he has quit smoking. His smoking use included cigars. He quit smokeless tobacco use about 55 years ago. He reports that he does not drink alcohol and does not use drugs.   Current Medications:          Prior to Admission medications   Medication Sig Start Date End Date Taking? Authorizing Provider  Ascorbic Acid (VITAMIN C) 1000 MG tablet Take 1,000 mg by mouth daily.        [provider]  carbidopa-levodopa (SINEMET IR) 25-100 MG tablet Take by mouth. 08/25/20 08/25/21   [provider]  carvedilol (COREG) 6.25 MG tablet Take by mouth. 11/12/20 11/12/21   [provider]  Cholecalciferol (VITAMIN D3) 1000 units CAPS Take 1,000 Units by mouth daily.        [provider]  clopidogrel (PLAVIX) 75 MG tablet Take 75 mg by mouth daily.       [provider]  finasteride  (PROSCAR ) 5 MG tablet Take 1 tablet (5 mg total) by mouth daily. 07/27/22     Stoioff, Glendia BROCKS, MD  furosemide (LASIX) 20 MG tablet Take 20 mg by mouth as needed.       [provider]  imiquimod  (ALDARA ) 5 % cream Apply topically daily. Apply to aa R bicep, R tricep, L cheek 08/11/22     Hester Alm BROCKS, MD  levothyroxine (SYNTHROID, LEVOTHROID) 50 MCG tablet Take 50 mcg by mouth daily before breakfast.        [provider]  losartan (COZAAR) 100 MG tablet Take 100 mg by mouth daily.        [provider]  Multiple Vitamins-Minerals (PRESERVISION AREDS PO) Take 1 tablet by mouth daily.  [provider]  pramipexole (MIRAPEX) 1 MG tablet Take by mouth. 09/23/20     [provider]  simvastatin (ZOCOR) 40 MG tablet Take 40 mg by mouth daily.       [provider]  traMADol  (ULTRAM ) 50 MG tablet Take by mouth every 6 (six) hours as needed.       [provider]      Allergies:       Allergies as of 04/06/2023 - Review Complete 04/06/2023  Allergen Reaction Noted   Morphine Other (See Comments) 12/30/2015      ROS:  General: Denies weight loss, weight gain, fatigue, fevers, chills, and night sweats. Eyes: Denies blurry vision, double vision, eye pain, itchy eyes, and tearing. Ears: Denies hearing loss,  earache, and ringing in ears. Nose: Denies sinus pain, congestion, infections, runny nose, and nosebleeds. Mouth/throat: Denies hoarseness, sore throat, bleeding gums, and difficulty swallowing. Heart: Denies chest pain, palpitations, racing heart, irregular heartbeat, leg pain or swelling, and decreased activity tolerance. Respiratory: Denies breathing difficulty, shortness of breath, wheezing, cough, and sputum. GI: Denies change in appetite, heartburn, nausea, vomiting, constipation, diarrhea, and blood in stool. GU: Denies difficulty urinating, pain with urinating, urgency, frequency, blood in urine. Musculoskeletal: Denies joint stiffness, pain, swelling, muscle weakness. Skin: Denies rash, itching, mass, tumors, sores, and boils Neurologic: Denies headache, fainting, dizziness, seizures, numbness, and tingling. Psychiatric: Denies depression, anxiety, difficulty sleeping, and memory loss. Endocrine: Denies heat or cold intolerance, and increased thirst or urination. Blood/lymph: Denies easy bruising, easy bruising, and swollen glands       Objective:    Objective BP 126/70 (BP Location: Right Arm)   Pulse (!) 57   Temp 97.6 F (36.4 C) (Oral)   Resp 18   Ht 5' 10 (1.778 m)   Wt 93 kg   SpO2 99%   BMI 29.41 kg/m    Constitutional :  alert, cooperative, appears stated age, and no distress  Lymphatics/Throat:  no asymmetry, masses, or scars  Respiratory:  clear to auscultation bilaterally  Cardiovascular:  irregularly irregular rhythm  Gastrointestinal: soft, non-tender; bowel sounds normal; no masses,  no organomegaly and right inguinal hernia, reducible, very small, minimal TTP  Musculoskeletal: lying in bed, no obvious difficulty moving upper extremities  Skin: Cool and moist  Psychiatric: Normal affect, non-agitated, not confused         LABS:      Latest Ref Rng & Units 04/06/2023    7:42 PM 02/01/2022   10:35 AM 01/18/2021    9:09 AM  CMP  Glucose 70 - 99 mg/dL  834  869  882   BUN 8 - 23 mg/dL 33  22  22   Creatinine 0.61 - 1.24 mg/dL 8.81  8.89  8.95   Sodium 135 - 145 mmol/L 141  142  141   Potassium 3.5 - 5.1 mmol/L 4.1  4.4  4.2   Chloride 98 - 111 mmol/L 103  112  105   CO2 22 - 32 mmol/L 25  26  28    Calcium 8.9 - 10.3 mg/dL 9.3  8.8  8.8   Total Protein 6.5 - 8.1 g/dL 6.8  6.6  6.8   Total Bilirubin 0.0 - 1.2 mg/dL 0.9  1.1  1.2   Alkaline Phos 38 - 126 U/L 52  53  46   AST 15 - 41 U/L 16  33  23   ALT 0 - 44 U/L 9  16  9  Latest Ref Rng & Units 04/06/2023    7:42 PM 02/01/2022   10:35 AM 01/18/2021    9:09 AM  CBC  WBC 4.0 - 10.5 K/uL 15.2  14.6  12.4   Hemoglobin 13.0 - 17.0 g/dL 86.0  87.9  86.6   Hematocrit 39.0 - 52.0 % 44.2  37.9  41.8   Platelets 150 - 400 K/uL 180  116  133       RADS: CLINICAL DATA:  Right-sided groin pain and bulging that started today.   EXAM: CT ABDOMEN AND PELVIS WITHOUT CONTRAST   TECHNIQUE: Multidetector CT imaging of the abdomen and pelvis was performed following the standard protocol without IV contrast.   RADIATION DOSE REDUCTION: This exam was performed according to the departmental dose-optimization program which includes automated exposure control, adjustment of the mA and/or kV according to patient size and/or use of iterative reconstruction technique.   COMPARISON:  None Available.   FINDINGS: Lower chest: Loculated pericardial effusion along the inferior aspect of the right heart measures 8.7 cm and is unchanged from 05/19/2017. No acute abnormality.   Hepatobiliary: Hepatic cysts. Gallbladder and biliary tree are unremarkable.   Pancreas: Unremarkable.   Spleen: Unremarkable.   Adrenals/Urinary Tract: Normal adrenal glands. No urinary calculi or hydronephrosis. Unremarkable bladder.   Stomach/Bowel: Stomach is within normal limits. Question mild wall thickening with hazy adjacent stranding about a loop of small bowel in the left hemiabdomen (series 2/image  39). Small bowel herniates into the right inguinal hernia. The small bowel upstream from the herniated loop is mildly prominent measuring 2.0 cm. Early or partial obstruction is not excluded. No evidence of strangulation. Normal caliber colon without wall thickening. Normal appendix. Extensive sigmoid diverticulosis without evidence of diverticulitis   Vascular/Lymphatic: Aortic atherosclerotic calcification. Retroperitoneal lymph node between the aorta and IVC measuring 1.3 cm (series 2/image 50). No additional enlarged lymph nodes.   Reproductive: Enlarged prostate.   Other: Misty mesentery with sparing of the perilymph nodal fat in the central mesentery. No fluid collection or abscess. No free intraperitoneal air. Fat containing bilateral inguinal hernias. The right inguinal hernia contains a nonobstructed loop of small bowel.   Musculoskeletal: No acute fracture.   IMPRESSION: 1. Right inguinal hernia contains a nonobstructed loop of small bowel. The small bowel upstream from the herniated loop is mildly prominent measuring 2.0 cm. Early or partial obstruction is not excluded. 2. Question mild wall thickening with hazy adjacent stranding about a loop of small bowel in the left hemiabdomen. This could be due to enteritis. 3. Misty mesentery with sparing of the perilymph nodal fat in the central mesentery. Findings are nonspecific but can be seen in the setting of mesenteric panniculitis. 4. Enlarged retroperitoneal lymph node between the aorta and IVC measuring 1.3 cm. Continued attention on follow-up. 5. Loculated pericardial effusion along the inferior aspect of the right heart measures 8.7 cm and is unchanged from 05/19/2017.   Aortic Atherosclerosis (ICD10-I70.0).     Electronically Signed   By: Norman Gatlin M.D.   On: 04/06/2023 21:05 Assessment:        Right inguinal hernia very small and reducible.  Due to degree of pain experienced prior to reduction,  recommended proceeding with elective repair. Plan:      Discussed the risk of surgery including recurrence, which can be up to 50% in the case of incisional or complex hernias, possible use of prosthetic materials (mesh) and the increased risk of mesh infxn if used, bleeding, chronic pain, post-op infxn,  post-op SBO or ileus, and possible re-operation to address said risks. The risks of general anesthetic, if used, includes MI, CVA, sudden death or even reaction to anesthetic medications also discussed. Alternatives include continued observation.  Benefits include possible symptom relief, prevention of incarceration, strangulation, enlargement in size over time, and the risk of emergency surgery in the face of strangulation.  Typical post-op recovery time of 3-5 days with 4-6 weeks of activity restrictions were also discussed.   The patient verbalized understanding and all questions were answered to the patient's satisfaction.   Patient will call office tomorrow to schedule the surgery.  ED return precautions discussed.  Okay to travel in the meantime Open repair due to history of laparoscopic repair in the past  labs/images/medications/previous chart entries reviewed personally and relevant changes/updates noted above.

## 2023-04-25 ENCOUNTER — Encounter: Payer: Self-pay | Admitting: Surgery

## 2023-04-26 ENCOUNTER — Encounter: Payer: Self-pay | Admitting: Surgery

## 2023-04-26 NOTE — Progress Notes (Signed)
 Perioperative / Anesthesia Services  Pre-Admission Testing Clinical Review / Pre-Operative Anesthesia Consult  Date: 04/26/23  Patient Demographics:  Name: Connor Mays DOB: [birth year] MRN:   [id number]  Planned Surgical Procedure(s):    Case: [id number] Date/Time: 04/28/23 1440   Procedure: HERNIA REPAIR INGUINAL ADULT (Right: Abdomen)   Anesthesia type: General   Pre-op diagnosis: K40.90 Right inguinal hernia reducible   Location: [facility] OR ROOM 02 / [facility]   Surgeons: Sung Amabile, DO      NOTE: Available PAT nursing documentation and vital signs have been reviewed. Clinical nursing staff has updated patient's PMH/PSHx, current medication list, and drug allergies/intolerances to ensure comprehensive history available to assist in medical decision making as it pertains to the aforementioned surgical procedure and anticipated anesthetic course. Extensive review of available clinical information personally performed. [place] [facility] and PSHx updated with any diagnoses/procedures that  may have been inadvertently omitted during his intake with the pre-admission testing department's nursing staff.  Clinical Discussion:  Connor Mays is a 89 y.o. male who is submitted for pre-surgical anesthesia review and clearance prior to him undergoing the above procedure.Patient is a Former Games developer. Pertinent PMH includes: CAD (s/p CABG), MI x 2, PAF, HFpEF, DVT, aortic atherosclerosis, chronic cerebral microvascular disease, IRBBB, HTN, HLD, T2DM, hypothyroidism, DOE, OSAH (does not require nocturnal PAP therapy), GERD (no daily Tx), RIGHT inguinal hernia, CLL, Parkinson's disease, RLS, BPH, OA, lumbar spondylosis, lumbar spinal stenosis, cognitive impairment.  Patient is followed by cardiology Juliann Pares, MD). He was last seen in the cardiology clinic on 01/19/2023; notes reviewed. At the time of his clinic visit, patient doing well overall from a cardiovascular perspective. Patient  denied any chest pain, shortness of breath, PND, orthopnea, palpitations, significant peripheral edema, weakness, fatigue, vertiginous symptoms, or presyncope/syncope. Patient with a past medical history significant for cardiovascular diagnoses. Documented physical exam was grossly benign, providing no evidence of acute exacerbation and/or decompensation of the patient's known cardiovascular conditions.  Of note, records regarding patient's complete cardiovascular history unavailable for review at time of consult.  Patient has received care outside of the state of [place]; previously received care in the state of [place].  Information gathered from patient/family report and through extensive review of notes provided by patient's local care providers.  Patient reported to have suffered an MI (unknown type) in 10/1986.  Patient with obvious multivessel CAD, as he underwent two-vessel revascularization and 02/1987 with LIMA-LAD and SVG-RCA bypass grafts being placed.  Procedure was performed at [facility] in [place].  Patient suffered a second MI (unknown type) in 2007.  He underwent PCI and placement of 2 stents (unknown type) in diseased areas of the ostial to mid RCA.  Patient underwent diagnostic LEFT heart catheterization on 05/02/2017 revealing multivessel CAD; 90% ostial to proximal LCx, 75% ostial LAD, 100% proximal LAD, 50% ostial ramus intermedius, 50% mid RCA-1, 50% mid RCA-2, 50% distal RCA-1, 95% distal RCA-2, and 100% posterior atrioventricular artery.  Stents within the RCA were noted to be patent.  LIMA-LAD bypass graft was also patent, SVG-RCA was occluded.  After review of the images by interventional cardiology, the decision was made to defer further intervention at that time opting for aggressive medical management.  Most recent TTE was performed on 10/07/2020 revealing a low normal left ventricular systolic function with an EF of 50%.  There was moderate LVH.  Left atrium severely enlarged  with mild contralateral enlargement of the RIGHT atrium.  There was trivial mitral  and mild tricuspid valve regurgitation.  RVSP = 32.5 mmHg. All transvalvular gradients were noted to be normal providing no evidence suggestive of valvular stenosis. Aorta normal in size with no evidence of ectasia or aneurysmal dilatation.   Patient with an atrial fibrillation diagnosis; CHA2DS2-VASc Score = 8 (age x 2, sex, HFpEF, HTN, DVT x 2, prior MI/vascular disease, T2DM). His rate and rhythm are currently being maintained on oral carvedilol. He is chronically anticoagulated using rivaroxaban; reported to be compliant with therapy with no evidence or reports of GI/GU bleeding.  Blood pressure well controlled at 118/68 mmHg on currently prescribed beta-blocker (carvedilol), ARB (losartan), and diuretic (spironolactone + torsemide) therapies. He is on simvastatin for his HLD diagnosis and further ASCVD prevention. T2DM well controlled on currently prescribed regimen; last HgbA1c was 6.5% when checked on 12/27/2021. He does have an OSAH diagnosis, however he does not require the use of nocturnal PAP therapy..  Functional capacity limited by patient's age, arthritides, and multiple medical comorbidities.  With that said, patient is able to complete his ADLs/IADLs without cardiovascular limitation.  Per the DASI, patient is able to achieve at least 4 METS of physical activity without experiencing any significant degrees of angina/anginal equivalent symptoms. No changes were made to his medication regimen during his visit with cardiology.  Patient scheduled to follow-up with outpatient cardiology in 6 months or sooner if needed.  Carlisle Beers Nordling is scheduled for an elective HERNIA REPAIR INGUINAL ADULT (Right: Abdomen) on 04/28/2023 with Dr. Sung Amabile, DO.  Given patient's past medical history significant for cardiovascular diagnoses, presurgical cardiac clearance was sought by the PAT team. Per cardiology, "this patient is  optimized for surgery and may proceed with the planned procedural course with a LOW risk of significant perioperative cardiovascular complications".  Again, this patient is on daily oral anticoagulation therapy using a DOAC medication.  He has been instructed on recommendations for holding his rivaroxaban for 5 days prior to his procedure with plans to restart as soon as postoperative bleeding risk felt to be minimized by his attending surgeon. The patient has been instructed that his last dose of cement should be on 04/22/2023.  Patient denies previous perioperative complications with anesthesia in the past. In review his EMR, it is noted that patient underwent a MAC anesthetic course at [facility] ([facility] in 09/2018 without documented complications.      04/06/2023   10:38 PM 04/06/2023    7:40 PM 04/06/2023    7:35 PM  Vitals with BMI  Height  5\' 10"    Weight  205 lbs   BMI  29.41   Systolic 126  149  Diastolic 70  70  Pulse 57  60   Providers/Specialists:  NOTE: Primary physician provider listed below. Patient may have been seen by APP or partner within same practice.   PROVIDER ROLE / SPECIALTY LAST Michelle Nasuti, DO General Surgery (Surgeon) 04/21/2023  Entzminger, Danton Clap, MD Primary Care Provider ???  Rudean Hitt, MD Cardiology 01/19/2023  Mosetta Pigeon, MD Nephrology 11/23/2022  Cristopher Peru, MD Neurology 06/16/2022   Allergies:   Allergies  Allergen Reactions   Morphine Other (See Comments)    Pre-syncope   Current Home Medications:   No current facility-administered medications for this encounter.    Ascorbic Acid (VITAMIN C) 1000 MG tablet   carbidopa-levodopa (SINEMET IR) 25-100 MG tablet   carvedilol (COREG) 6.25 MG tablet   Cholecalciferol (VITAMIN D3) 1000 units CAPS   Cyanocobalamin (B-12 PO)  finasteride (PROSCAR) 5 MG tablet   levothyroxine (SYNTHROID, LEVOTHROID) 50 MCG tablet   losartan (COZAAR) 100 MG tablet   MAGNESIUM PO    Multiple Vitamins-Minerals (PRESERVISION AREDS PO)   pramipexole (MIRAPEX) 1 MG tablet   simvastatin (ZOCOR) 40 MG tablet   spironolactone (ALDACTONE) 25 MG tablet   torsemide (DEMADEX) 20 MG tablet   XARELTO 20 MG TABS tablet   History:   Past Medical History:  Diagnosis Date   (HFpEF) heart failure with preserved ejection fraction (HCC)    a.) TTE 08/31/2017: EF > 55%, mild LVH, G2DD, mod LAE, mild RAE, triv panval regurg; b.) TTE 10/07/2020: EF 50%, mod LVH, sev LAE, mild RAE, RVSP 32.5, triv MR, mild TR   Acute deep vein thrombosis (DVT) of right peroneal vein (HCC)    Aortic atherosclerosis (HCC)    Arthritis    Atrial fibrillation and flutter (HCC)    a.) CHA2DS2VASc = 8 (age x2, sex, HFpEF, HTN, DVT x2, prior MI/vascular disease, T2DM) as of 04/26/2023; b.) rate/rhythm maintained on oral carvedilol; chronically anticoagulated with rivaroxaban   Bilateral lower extremity edema    BPH (benign prostatic hyperplasia)    CAD (coronary artery disease)    a.) s/p MI (unk type) 10/1986; b.) s/p 2v CABG 02/1987 (LIMA-LAD, SVG-RCA); c.) s/p MI (unk type) 2007 --> stents x 2 (unk type) to o-mRCA; d.) LHC 05/02/2017: 90% o-pLCx, 75% oLAD, 100% pLAD, 50% oRI, 50/50% mRCA, 50/95% dRCA, 100% post atrio. Patient RCA stents. SVG-RCA occluded --> med mgmt   Cerebral microvascular disease    CLL (chronic lymphocytic leukemia) (HCC)    Cognitive impairment    DOE (dyspnea on exertion)    GERD (gastroesophageal reflux disease)    History of bilateral cataract extraction 2020   Hyperlipidemia    Hypertension    Hypothyroidism    Incomplete RBBB    Lumbar spinal stenosis    Lumbar spondylolysis    Malignant melanoma in situ (HCC) 05/23/2022   R lat tricep Lentigo Maligna type  - LN2 06/28/22 Imiquimod start 08/11/22   Malignant melanoma in situ (HCC) 05/23/2022   R lat bicep Lentigo Maligna type  - LN2 06/28/22, Imiquimod start 08/11/22   Malignant melanoma in situ (HCC) 06/28/2022   L  cheek - lentigo maligna type, LN2 07/07/22, Imiquimod start 08/11/22   Myocardial infarct [facility]) 10/1986   Myocardial infarct (HCC) 2007   a.) s/p PCI with stents x 2 (unk type) o-mRCA   On rivaroxaban therapy    OSA (obstructive sleep apnea)    a.) does not use nocturnal PAP therapy   Parkinson's disease (HCC)    Right inguinal hernia    RLS (restless legs syndrome)    a.) on pramipexole   S/P CABG x 2 1988   a.) [facility] in [place]; LIMA-LAD, SVG-RCA   Squamous cell carcinoma of skin 08/11/2022   right dorsum hand, treated with EDC   T2DM (type 2 diabetes mellitus) (HCC)    Weakness of both lower extremities    Past Surgical History:  Procedure Laterality Date   CATARACT EXTRACTION W/PHACO Right 08/13/2018   Procedure: CATARACT EXTRACTION PHACO AND INTRAOCULAR LENS PLACEMENT (IOC) RIGHT;  Surgeon: Nevada Crane, MD;  Location: [facility];  Service: Ophthalmology;  Laterality: Right;   CATARACT EXTRACTION W/PHACO Left 09/17/2018   Procedure: CATARACT EXTRACTION PHACO AND INTRAOCULAR LENS PLACEMENT (IOC)  LEFT;  Surgeon: Nevada Crane, MD;  Location: [facility];  Service: Ophthalmology;  Laterality: Left;  COLONOSCOPY W/ POLYPECTOMY     CORONARY ARTERY BYPASS GRAFT N/A 02/1987   Procedure: CORONARY ARTERY BYPASS GRAFT; Location: [facility] in [place]   cyst removed     bottom   HERNIA REPAIR     LEFT HEART CATH AND CORONARY ANGIOGRAPHY Left 10/1986   LEFT HEART CATH AND CORS/GRAFTS ANGIOGRAPHY N/A 05/02/2017   Procedure: LEFT HEART CATH AND CORS/GRAFTS ANGIOGRAPHY;  Surgeon: Alwyn Pea, MD;  Location: [facility] INVASIVE CV LAB;  Service: Cardiovascular;  Laterality: N/A;   LEFT HEART CATH AND CORS/GRAFTS ANGIOGRAPHY AND STENT INTERVENTION Left 2007   TONSILLECTOMY     Family History  Problem Relation Age of Onset   Healthy Sister    Bladder Cancer Brother    Healthy Sister    Social History   Tobacco Use   Smoking status: Former    Types:  Cigars   Smokeless tobacco: Former    Quit date: 1970   Tobacco comments:    quit over 40 yrs ago  Substance Use Topics   Alcohol use: No    Comment: may have a drink 1-2x/yr   Pertinent Clinical Results:  LABS:  Admission on 04/06/2023, Discharged on 04/06/2023  Component Date Value Ref Range Status   Lipase 04/06/2023 30  11 - 51 U/L Final   Performed at [facility], [address] [place], [place] [address]   Sodium 04/06/2023 141  135 - 145 mmol/L Final   Potassium 04/06/2023 4.1  3.5 - 5.1 mmol/L Final   Chloride 04/06/2023 103  98 - 111 mmol/L Final   CO2 04/06/2023 25  22 - 32 mmol/L Final   Glucose, Bld 04/06/2023 165 (H)  70 - 99 mg/dL Final   Glucose reference range applies only to samples taken after fasting for at least 8 hours.   BUN 04/06/2023 33 (H)  8 - 23 mg/dL Final   Creatinine, Ser 04/06/2023 1.18  0.61 - 1.24 mg/dL Final   Calcium 13/10/6576 9.3  8.9 - 10.3 mg/dL Final   Total Protein 46/96/2952 6.8  6.5 - 8.1 g/dL Final   Albumin 84/13/2440 4.2  3.5 - 5.0 g/dL Final   AST 01/08/2535 16  15 - 41 U/L Final   ALT 04/06/2023 9  0 - 44 U/L Final   Alkaline Phosphatase 04/06/2023 52  38 - 126 U/L Final   Total Bilirubin 04/06/2023 0.9  0.0 - 1.2 mg/dL Final   GFR, Estimated 04/06/2023 59 (L)  >60 mL/min Final   Comment: (NOTE) Calculated using the CKD-EPI Creatinine Equation (2021)    Anion gap 04/06/2023 13  5 - 15 Final   Performed at [facility], [address] [place], [place] [address]   WBC 04/06/2023 15.2 (H)  4.0 - 10.5 K/uL Final   RBC 04/06/2023 4.74  4.22 - 5.81 MIL/uL Final   Hemoglobin 04/06/2023 13.9  13.0 - 17.0 g/dL Final   HCT 47/42/5956 44.2  39.0 - 52.0 % Final   MCV 04/06/2023 93.2  80.0 - 100.0 fL Final   MCH 04/06/2023 29.3  26.0 - 34.0 pg Final   MCHC 04/06/2023 31.4  30.0 - 36.0 g/dL Final   RDW 38/75/6433 13.8  11.5 - 15.5 % Final   Platelets 04/06/2023 180  150 - 400 K/uL Final   nRBC 04/06/2023 0.0  0.0 -  0.2 % Final   Performed at [facility], [address] [place], [place] [address]   Color, Urine 04/06/2023 YELLOW (A)  YELLOW Final  APPearance 04/06/2023 CLEAR (A)  CLEAR Final   Specific Gravity, Urine 04/06/2023 1.025  1.005 - 1.030 Final   pH 04/06/2023 5.0  5.0 - 8.0 Final   Glucose, UA 04/06/2023 NEGATIVE  NEGATIVE mg/dL Final   Hgb urine dipstick 04/06/2023 NEGATIVE  NEGATIVE Final   Bilirubin Urine 04/06/2023 NEGATIVE  NEGATIVE Final   Ketones, ur 04/06/2023 5 (A)  NEGATIVE mg/dL Final   Protein, ur 16/12/9602 NEGATIVE  NEGATIVE mg/dL Final   Nitrite 54/11/8117 NEGATIVE  NEGATIVE Final   Leukocytes,Ua 04/06/2023 NEGATIVE  NEGATIVE Final   Performed at [facility], [address] [place], [place] [address]    ECG: Date: 04/21/2023 Time ECG obtained: 0925 AM Rate: 55 bpm Rhythm:  Atrial flutter with variable AV block Axis (leads I and aVF): right Intervals: QRS 96 ms. QTc 386 ms. ST segment and T wave changes: Nonspecific anterolateral ST and T wave abnormalities  evidence of a possible, age undetermined, prior infarct:  No Comparison: Similar to previous tracing obtained on 02/01/2022   IMAGING / PROCEDURES: CT ABDOMEN PELVIS WO CONTRAST performed on 04/06/2023 Right inguinal hernia contains a nonobstructed loop of small bowel. The small bowel upstream from the herniated loop is mildly prominent measuring 2.0 cm. Early or partial obstruction is not excluded. Question mild wall thickening with hazy adjacent stranding about a loop of small bowel in the left hemiabdomen. This could be due to enteritis. Misty mesentery with sparing of the perilymph nodal fat in the central mesentery. Findings are nonspecific but can be seen in the setting of mesenteric panniculitis. Enlarged retroperitoneal lymph node between the aorta and IVC measuring 1.3 cm. Continued attention on follow-up. Loculated pericardial effusion along the inferior aspect of the right heart  measures 8.7 cm and is unchanged from 05/19/2017. Aortic atherosclerosis  MR LUMBAR SPINE WO CONTRAST performed on 06/21/2022 No acute findings or clear explanation for the patient's symptoms. Mildly progressive spondylosis at L1-2 with resulting mild narrowing of the left lateral recess and left foramen. Stable mild multifactorial spinal stenosis at L3-4 with mild lateral recess and foraminal narrowing bilaterally. Unchanged moderate multifactorial spinal stenosis at L4-5 with asymmetric right lateral recess and right foraminal narrowing. 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  TRANSTHORACIC ECHOCARDIOGRAM performed on 10/07/2020 Low normal left ventricular systolic function with an EF of 50% Moderate LVH Severely enlarged left atrium Mildly enlarged right atrium Trivial MR Mild TR RVSP = 32.5 mmHg Normal gradients; no valvular stenosis No pericardial effusion  LEFT HEART CATHETERIZATION AND CORONARY ANGIOGRAPHY performed on 05/02/2017 Low normal left ventricular systolic function with an EF of 50% Borderline anterior/apical hypokinesis Multivessel CAD 90% ostial to proximal Cx 75% ostial LAD 100% proximal LAD 50% ostial ramus intermedius 50% mid RCA-1 50% mid RCA-2 50% distal RCA-1 95% distal RCA-2 100% posterior atrioventricular Bypass grafts: LIMA-LAD widely patent SVG-RCA occluded Recommendations: defer intervention and treat with aggressive medical therapy    Impression and Plan:  Connor Mays has been referred for pre-anesthesia review and clearance prior to him undergoing the planned anesthetic and procedural courses. Available labs, pertinent testing, and imaging results were personally reviewed by me in preparation for upcoming operative/procedural course. [facility] medical record has been updated following extensive record review and patient interview with PAT staff.   This patient has been appropriately cleared by cardiology with an overall LOW  risk of experiencing significant perioperative cardiovascular complications. Based on clinical review performed today (04/26/23), barring any significant acute changes in the patient's overall condition, it  is anticipated that he will be able to proceed with the planned surgical intervention. Any acute changes in clinical condition may necessitate his procedure being postponed and/or cancelled. Patient will meet with anesthesia team (MD and/or CRNA) on the day of his procedure for preoperative evaluation/assessment. Questions regarding anesthetic course will be fielded at that time.   Pre-surgical instructions were reviewed with the patient during his PAT appointment, and questions were fielded to satisfaction by PAT clinical staff. He has been instructed on which medications that he will need to hold prior to surgery, as well as the ones that have been deemed safe/appropriate to take on the day of his procedure. As part of the general education provided by PAT, patient made aware both verbally and in writing, that he would need to abstain from the use of any illegal substances during his perioperative course. He was advised that failure to follow the provided instructions could necessitate case cancellation or result in serious perioperative complications up to and including death. Patient encouraged to contact PAT and/or his surgeon's office to discuss any questions or concerns that may arise prior to surgery; verbalized understanding.   Quentin Mulling, MSN, APRN, FNP-C, CEN [facility]  Perioperative Services Nurse Practitioner Phone: [phone] Fax: [phone] 04/26/23 9:16 AM  NOTE: This note has been prepared using Dragon dictation software. Despite my best ability to proofread, there is always the potential that unintentional transcriptional errors may still occur from this process.

## 2023-04-27 ENCOUNTER — Encounter: Payer: Self-pay | Admitting: Surgery

## 2023-04-28 ENCOUNTER — Encounter
Admission: RE | Disposition: A | Discharge status: Home / Self Care | Payer: Self-pay | Source: Ambulatory Visit | Attending: Surgery

## 2023-04-28 ENCOUNTER — Ambulatory Visit: Payer: Medicare HMO | Admitting: Urgent Care

## 2023-04-28 ENCOUNTER — Other Ambulatory Visit: Payer: Self-pay

## 2023-04-28 ENCOUNTER — Ambulatory Visit: Payer: Self-pay | Admitting: Urgent Care

## 2023-04-28 ENCOUNTER — Ambulatory Visit
Admission: RE | Admit: 2023-04-28 | Discharge: 2023-04-28 | Disposition: A | Discharge status: Home / Self Care | Payer: Medicare HMO | Source: Ambulatory Visit | Attending: Surgery | Admitting: Surgery

## 2023-04-28 ENCOUNTER — Encounter: Payer: Self-pay | Admitting: Surgery

## 2023-04-28 DIAGNOSIS — G20A1 Parkinson's disease without dyskinesia, without mention of fluctuations: Secondary | ICD-10-CM | POA: Insufficient documentation

## 2023-04-28 DIAGNOSIS — I2510 Atherosclerotic heart disease of native coronary artery without angina pectoris: Secondary | ICD-10-CM | POA: Diagnosis not present

## 2023-04-28 DIAGNOSIS — I5032 Chronic diastolic (congestive) heart failure: Secondary | ICD-10-CM | POA: Diagnosis not present

## 2023-04-28 DIAGNOSIS — G4730 Sleep apnea, unspecified: Secondary | ICD-10-CM | POA: Diagnosis not present

## 2023-04-28 DIAGNOSIS — Z955 Presence of coronary angioplasty implant and graft: Secondary | ICD-10-CM | POA: Insufficient documentation

## 2023-04-28 DIAGNOSIS — I4510 Unspecified right bundle-branch block: Secondary | ICD-10-CM | POA: Diagnosis not present

## 2023-04-28 DIAGNOSIS — E119 Type 2 diabetes mellitus without complications: Secondary | ICD-10-CM | POA: Diagnosis not present

## 2023-04-28 DIAGNOSIS — Z86718 Personal history of other venous thrombosis and embolism: Secondary | ICD-10-CM | POA: Diagnosis not present

## 2023-04-28 DIAGNOSIS — Z79899 Other long term (current) drug therapy: Secondary | ICD-10-CM | POA: Insufficient documentation

## 2023-04-28 DIAGNOSIS — G4733 Obstructive sleep apnea (adult) (pediatric): Secondary | ICD-10-CM | POA: Diagnosis not present

## 2023-04-28 DIAGNOSIS — I110 Hypertensive heart disease with heart failure: Secondary | ICD-10-CM | POA: Insufficient documentation

## 2023-04-28 DIAGNOSIS — C9110 Chronic lymphocytic leukemia of B-cell type not having achieved remission: Secondary | ICD-10-CM | POA: Insufficient documentation

## 2023-04-28 DIAGNOSIS — I700 Atherosclerosis of aorta: Secondary | ICD-10-CM | POA: Diagnosis not present

## 2023-04-28 DIAGNOSIS — Z7901 Long term (current) use of anticoagulants: Secondary | ICD-10-CM | POA: Diagnosis not present

## 2023-04-28 DIAGNOSIS — K4031 Unilateral inguinal hernia, with obstruction, without gangrene, recurrent: Secondary | ICD-10-CM | POA: Diagnosis present

## 2023-04-28 DIAGNOSIS — E039 Hypothyroidism, unspecified: Secondary | ICD-10-CM | POA: Diagnosis not present

## 2023-04-28 DIAGNOSIS — I4891 Unspecified atrial fibrillation: Secondary | ICD-10-CM | POA: Diagnosis not present

## 2023-04-28 DIAGNOSIS — E785 Hyperlipidemia, unspecified: Secondary | ICD-10-CM | POA: Insufficient documentation

## 2023-04-28 DIAGNOSIS — K4090 Unilateral inguinal hernia, without obstruction or gangrene, not specified as recurrent: Secondary | ICD-10-CM

## 2023-04-28 DIAGNOSIS — I252 Old myocardial infarction: Secondary | ICD-10-CM | POA: Diagnosis not present

## 2023-04-28 DIAGNOSIS — Z87891 Personal history of nicotine dependence: Secondary | ICD-10-CM | POA: Diagnosis not present

## 2023-04-28 HISTORY — DX: Paroxysmal atrial fibrillation: I48.0

## 2023-04-28 HISTORY — DX: Spinal stenosis, lumbar region without neurogenic claudication: M48.061

## 2023-04-28 HISTORY — DX: Other symptoms and signs involving the musculoskeletal system: R29.898

## 2023-04-28 HISTORY — DX: Other symptoms and signs involving cognitive functions and awareness: R41.89

## 2023-04-28 HISTORY — DX: Other forms of dyspnea: R06.09

## 2023-04-28 HISTORY — DX: Chronic lymphocytic leukemia of B-cell type not having achieved remission: C91.10

## 2023-04-28 HISTORY — DX: Atherosclerosis of aorta: I70.0

## 2023-04-28 HISTORY — DX: Type 2 diabetes mellitus without complications: E11.9

## 2023-04-28 HISTORY — DX: Unspecified diastolic (congestive) heart failure: I50.30

## 2023-04-28 HISTORY — DX: Long term (current) use of anticoagulants: Z79.01

## 2023-04-28 HISTORY — DX: Restless legs syndrome: G25.81

## 2023-04-28 HISTORY — DX: Parkinson's disease without dyskinesia, without mention of fluctuations: G20.A1

## 2023-04-28 HISTORY — DX: Benign prostatic hyperplasia without lower urinary tract symptoms: N40.0

## 2023-04-28 HISTORY — DX: Unspecified atrial fibrillation: I48.91

## 2023-04-28 HISTORY — DX: Localized edema: R60.0

## 2023-04-28 HISTORY — PX: INGUINAL HERNIA REPAIR: SHX194

## 2023-04-28 HISTORY — DX: Spondylolysis, lumbar region: M43.06

## 2023-04-28 HISTORY — DX: Unspecified right bundle-branch block: I45.10

## 2023-04-28 HISTORY — DX: Obstructive sleep apnea (adult) (pediatric): G47.33

## 2023-04-28 HISTORY — DX: Other cerebrovascular disease: I67.89

## 2023-04-28 LAB — GLUCOSE, CAPILLARY: Glucose-Capillary: 97 mg/dL (ref 70–99)

## 2023-04-28 SURGERY — REPAIR, HERNIA, INGUINAL, ADULT
Anesthesia: General | Site: Abdomen | Laterality: Right

## 2023-04-28 MED ORDER — DOCUSATE SODIUM 100 MG PO CAPS: 100.0000 mg | ORAL_CAPSULE | Freq: Two times a day (BID) | ORAL | 0 refills | Status: AC | PRN

## 2023-04-28 MED ORDER — ACETAMINOPHEN 500 MG PO TABS
ORAL_TABLET | ORAL | Status: AC
  Filled 2023-04-28: qty 2

## 2023-04-28 MED ORDER — DEXAMETHASONE SODIUM PHOSPHATE 10 MG/ML IJ SOLN
INTRAMUSCULAR | Status: AC
  Filled 2023-04-28: qty 1

## 2023-04-28 MED ORDER — BUPIVACAINE LIPOSOME 1.3 % IJ SUSP
INTRAMUSCULAR | Status: DC | PRN
  Administered 2023-04-28: 20 mL

## 2023-04-28 MED ORDER — ACETAMINOPHEN 500 MG PO TABS
1000.0000 mg | ORAL_TABLET | ORAL | Status: AC
  Administered 2023-04-28: 1000 mg via ORAL

## 2023-04-28 MED ORDER — CHLORHEXIDINE GLUCONATE CLOTH 2 % EX PADS
6.0000 | MEDICATED_PAD | Freq: Once | CUTANEOUS | Status: AC
  Administered 2023-04-28: 6 via TOPICAL

## 2023-04-28 MED ORDER — ACETAMINOPHEN 10 MG/ML IV SOLN: 1000.0000 mg | Freq: Once | INTRAVENOUS | Status: DC | PRN

## 2023-04-28 MED ORDER — BUPIVACAINE-EPINEPHRINE (PF) 0.5% -1:200000 IJ SOLN
INTRAMUSCULAR | Status: AC
  Filled 2023-04-28: qty 20

## 2023-04-28 MED ORDER — PHENYLEPHRINE 80 MCG/ML (10ML) SYRINGE FOR IV PUSH (FOR BLOOD PRESSURE SUPPORT)
PREFILLED_SYRINGE | INTRAVENOUS | Status: AC
  Filled 2023-04-28: qty 10

## 2023-04-28 MED ORDER — SUGAMMADEX SODIUM 200 MG/2ML IV SOLN
INTRAVENOUS | Status: DC | PRN
  Administered 2023-04-28: 200 mg via INTRAVENOUS

## 2023-04-28 MED ORDER — EPHEDRINE SULFATE-NACL 50-0.9 MG/10ML-% IV SOSY
PREFILLED_SYRINGE | INTRAVENOUS | Status: DC | PRN
  Administered 2023-04-28: 5 mg via INTRAVENOUS

## 2023-04-28 MED ORDER — PHENYLEPHRINE 80 MCG/ML (10ML) SYRINGE FOR IV PUSH (FOR BLOOD PRESSURE SUPPORT)
PREFILLED_SYRINGE | INTRAVENOUS | Status: DC | PRN
  Administered 2023-04-28 (×2): 160 ug via INTRAVENOUS

## 2023-04-28 MED ORDER — FENTANYL CITRATE (PF) 100 MCG/2ML IJ SOLN
INTRAMUSCULAR | Status: AC
  Filled 2023-04-28: qty 2

## 2023-04-28 MED ORDER — PHENYLEPHRINE HCL-NACL 20-0.9 MG/250ML-% IV SOLN
INTRAVENOUS | Status: DC | PRN
  Administered 2023-04-28: 20 ug/min via INTRAVENOUS

## 2023-04-28 MED ORDER — DEXAMETHASONE SODIUM PHOSPHATE 10 MG/ML IJ SOLN
INTRAMUSCULAR | Status: DC | PRN
  Administered 2023-04-28: 5 mg via INTRAVENOUS

## 2023-04-28 MED ORDER — LACTATED RINGERS IV SOLN: INTRAVENOUS | Status: DC

## 2023-04-28 MED ORDER — TRAMADOL HCL 50 MG PO TABS: 50.0000 mg | ORAL_TABLET | Freq: Four times a day (QID) | ORAL | 0 refills | Status: AC | PRN

## 2023-04-28 MED ORDER — CELECOXIB 200 MG PO CAPS
200.0000 mg | ORAL_CAPSULE | ORAL | Status: AC
  Administered 2023-04-28: 200 mg via ORAL

## 2023-04-28 MED ORDER — CELECOXIB 200 MG PO CAPS
ORAL_CAPSULE | ORAL | Status: AC
  Filled 2023-04-28: qty 1

## 2023-04-28 MED ORDER — CEFAZOLIN SODIUM-DEXTROSE 2-4 GM/100ML-% IV SOLN
INTRAVENOUS | Status: AC
  Filled 2023-04-28: qty 100

## 2023-04-28 MED ORDER — BUPIVACAINE-EPINEPHRINE (PF) 0.5% -1:200000 IJ SOLN
INTRAMUSCULAR | Status: DC | PRN
  Administered 2023-04-28: 13 mL via PERINEURAL

## 2023-04-28 MED ORDER — OXYCODONE HCL 5 MG/5ML PO SOLN: 5.0000 mg | Freq: Once | ORAL | Status: DC | PRN

## 2023-04-28 MED ORDER — LIDOCAINE HCL (PF) 2 % IJ SOLN
INTRAMUSCULAR | Status: AC
  Filled 2023-04-28: qty 5

## 2023-04-28 MED ORDER — GABAPENTIN 300 MG PO CAPS
ORAL_CAPSULE | ORAL | Status: AC
  Filled 2023-04-28: qty 1

## 2023-04-28 MED ORDER — BUPIVACAINE LIPOSOME 1.3 % IJ SUSP
INTRAMUSCULAR | Status: AC
  Filled 2023-04-28: qty 20

## 2023-04-28 MED ORDER — FENTANYL CITRATE (PF) 100 MCG/2ML IJ SOLN: 25.0000 ug | INTRAMUSCULAR | Status: DC | PRN

## 2023-04-28 MED ORDER — OXYCODONE HCL 5 MG PO TABS: 5.0000 mg | ORAL_TABLET | Freq: Once | ORAL | Status: DC | PRN

## 2023-04-28 MED ORDER — LIDOCAINE HCL (CARDIAC) PF 100 MG/5ML IV SOSY
PREFILLED_SYRINGE | INTRAVENOUS | Status: DC | PRN
  Administered 2023-04-28: 100 mg via INTRAVENOUS

## 2023-04-28 MED ORDER — ONDANSETRON HCL 4 MG/2ML IJ SOLN
INTRAMUSCULAR | Status: DC | PRN
Start: 2023-04-28 — End: 2023-04-28
  Administered 2023-04-28: 4 mg via INTRAVENOUS

## 2023-04-28 MED ORDER — GABAPENTIN 300 MG PO CAPS
300.0000 mg | ORAL_CAPSULE | ORAL | Status: AC
  Administered 2023-04-28: 300 mg via ORAL

## 2023-04-28 MED ORDER — FENTANYL CITRATE (PF) 100 MCG/2ML IJ SOLN
INTRAMUSCULAR | Status: DC | PRN
  Administered 2023-04-28: 50 ug via INTRAVENOUS
  Administered 2023-04-28 (×2): 25 ug via INTRAVENOUS

## 2023-04-28 MED ORDER — DROPERIDOL 2.5 MG/ML IJ SOLN: 0.6250 mg | Freq: Once | INTRAMUSCULAR | Status: DC | PRN

## 2023-04-28 MED ORDER — ROCURONIUM BROMIDE 100 MG/10ML IV SOLN
INTRAVENOUS | Status: DC | PRN
  Administered 2023-04-28: 5 mg via INTRAVENOUS
  Administered 2023-04-28: 45 mg via INTRAVENOUS
  Administered 2023-04-28: 10 mg via INTRAVENOUS

## 2023-04-28 MED ORDER — ONDANSETRON HCL 4 MG/2ML IJ SOLN
INTRAMUSCULAR | Status: AC
  Filled 2023-04-28: qty 2

## 2023-04-28 MED ORDER — CHLORHEXIDINE GLUCONATE 0.12 % MT SOLN
15.0000 mL | Freq: Once | OROMUCOSAL | Status: AC
  Administered 2023-04-28: 15 mL via OROMUCOSAL

## 2023-04-28 MED ORDER — CEFAZOLIN SODIUM-DEXTROSE 2-4 GM/100ML-% IV SOLN
2.0000 g | INTRAVENOUS | Status: AC
  Administered 2023-04-28: 2 g via INTRAVENOUS

## 2023-04-28 MED ORDER — SUCCINYLCHOLINE CHLORIDE 200 MG/10ML IV SOSY
PREFILLED_SYRINGE | INTRAVENOUS | Status: DC | PRN
  Administered 2023-04-28: 100 mg via INTRAVENOUS

## 2023-04-28 MED ORDER — CHLORHEXIDINE GLUCONATE 0.12 % MT SOLN
OROMUCOSAL | Status: AC
  Filled 2023-04-28: qty 15

## 2023-04-28 MED ORDER — PROPOFOL 10 MG/ML IV BOLUS
INTRAVENOUS | Status: DC | PRN
  Administered 2023-04-28: 140 mg via INTRAVENOUS
  Administered 2023-04-28: 60 mg via INTRAVENOUS

## 2023-04-28 MED ORDER — PROPOFOL 10 MG/ML IV BOLUS
INTRAVENOUS | Status: AC
  Filled 2023-04-28: qty 20

## 2023-04-28 MED ORDER — ORAL CARE MOUTH RINSE: 15.0000 mL | Freq: Once | OROMUCOSAL | Status: AC

## 2023-04-28 MED ORDER — ACETAMINOPHEN 325 MG PO TABS: 650.0000 mg | ORAL_TABLET | Freq: Three times a day (TID) | ORAL | 0 refills | Status: AC | PRN

## 2023-04-28 MED ORDER — 0.9 % SODIUM CHLORIDE (POUR BTL) OPTIME
TOPICAL | Status: DC | PRN
  Administered 2023-04-28: 500 mL

## 2023-04-28 MED ORDER — EPHEDRINE 5 MG/ML INJ
INTRAVENOUS | Status: AC
  Filled 2023-04-28: qty 5

## 2023-04-28 SURGICAL SUPPLY — 35 items
BLADE CLIPPER SURG (BLADE) IMPLANT
BLADE SURG 15 STRL LF DISP TIS (BLADE) ×1 IMPLANT
CHLORAPREP W/TINT 26 (MISCELLANEOUS) IMPLANT
DERMABOND ADVANCED .7 DNX12 (GAUZE/BANDAGES/DRESSINGS) ×1 IMPLANT
DRAIN PENROSE 12X.25 LTX STRL (MISCELLANEOUS) ×1 IMPLANT
DRAPE LAPAROTOMY 100X77 ABD (DRAPES) ×1 IMPLANT
ELECT REM PT RETURN 9FT ADLT (ELECTROSURGICAL) ×1 IMPLANT
ELECTRODE REM PT RTRN 9FT ADLT (ELECTROSURGICAL) ×1 IMPLANT
GLOVE BIOGEL PI IND STRL 7.0 (GLOVE) ×1 IMPLANT
GLOVE SURG SYN 6.5 ES PF (GLOVE) ×1 IMPLANT
GLOVE SURG SYN 6.5 PF PI (GLOVE) ×1 IMPLANT
GOWN STRL REUS W/ TWL LRG LVL3 (GOWN DISPOSABLE) ×2 IMPLANT
LABEL OR SOLS (LABEL) ×1 IMPLANT
MANIFOLD NEPTUNE II (INSTRUMENTS) ×1 IMPLANT
MESH PARIETEX PROGRIP RIGHT (Mesh General) IMPLANT
NDL HYPO 22X1.5 SAFETY MO (MISCELLANEOUS) ×1 IMPLANT
NEEDLE HYPO 22X1.5 SAFETY MO (MISCELLANEOUS) ×1 IMPLANT
NS IRRIG 500ML POUR BTL (IV SOLUTION) ×1 IMPLANT
PACK BASIN MINOR ARMC (MISCELLANEOUS) ×1 IMPLANT
RETRACTOR WOUND ALXS 18CM MED (MISCELLANEOUS) IMPLANT
RTRCTR WOUND ALEXIS O 18CM MED (MISCELLANEOUS) ×1 IMPLANT
SUT ETHIBOND NAB MO 7 #0 18IN (SUTURE) ×1 IMPLANT
SUT MNCRL 4-0 27 PS-2 XMFL (SUTURE) ×1 IMPLANT
SUT SILK 3 0 12 30 (SUTURE) IMPLANT
SUT SILK 3 0 SH 30 (SUTURE) IMPLANT
SUT VIC AB 2-0 CT2 27 (SUTURE) ×1 IMPLANT
SUT VIC AB 3-0 SH 27X BRD (SUTURE) ×1 IMPLANT
SUTURE MNCRL 4-0 27XMF (SUTURE) ×1 IMPLANT
SYR 10ML LL (SYRINGE) ×1 IMPLANT
SYR 20ML LL LF (SYRINGE) ×1 IMPLANT
SYR BULB IRRIG 60ML STRL (SYRINGE) ×1 IMPLANT
TOWEL OR 17X26 4PK STRL BLUE (TOWEL DISPOSABLE) IMPLANT
TRAP FLUID SMOKE EVACUATOR (MISCELLANEOUS) ×1 IMPLANT
WATER STERILE IRR 1000ML POUR (IV SOLUTION) ×1 IMPLANT
WATER STERILE IRR 500ML POUR (IV SOLUTION) ×1 IMPLANT

## 2023-04-28 NOTE — Op Note (Addendum)
Preoperative diagnosis: Right recurrent reducible inguinal Hernia.  Postoperative diagnosis: Right recurrent incarcerated inguinal Hernia  Procedure:  Open right recurrent incarcerated inguinal hernia repair with mesh  Anesthesia: General, LMA  Surgeon: Dr. Tonna Boehringer  Wound Classification: Clean  Specimen: none  Complications: None  Estimated Blood Loss: 10mL   Indications:  Patient is a 89 y.o. male developed a symptomatic right inguinal hernia. Repair was indicated to avoid complications of incarceration, obstruction and pain, and a prosthetic mesh repair was elected.  Findings: 1. Vas Deferens and cord structures identified and preserved 2. Progrip mesh used for repair 3. Adequate hemostasis achieved 4.  Excess hernia sac and preperitoneal fat suture-ligated and removed to facilitate reduction  Description of procedure: The patient was taken to the operating room. A time-out was completed verifying correct patient, procedure, site, positioning, and implant(s) and/or special equipment prior to beginning this procedure. The right groin was prepped and draped in the usual sterile fashion. An incision was marked in a natural skin crease and planned to end near the pubic tubercle.  The skin crease incision was made with a knife and deepened through Scarpa's and Camper's fascia with electrocautery until the aponeurosis of the external oblique was encountered. This was cleaned and the external ring was exposed. Hemostasis was achieved in the wound. An incision was made in the midportion of the external oblique aponeurosis in the direction of its fibers. The ilioinguinal nerve was identified and transected to facilitate further dissection.  Flaps of the external oblique were developed cephalad and inferiorly.  The cord was identified. It was gently dissected free at the pubic tubercle and encircled with a Penrose drain. Attention was directed to the anteromedial aspect of the cord, where an large  indirect hernia sac was eventually identified. The sac was carefully dissected free of the cord down to the level of the internal ring and reduction attempted but due to the amount of herniation through the small ring, full reduction was not possible.  2-0 Vicryl were then used to suture ligate the base of the hernia contents.  Hernia sac was visualized within the transected tissue along with preperitoneal fat and this was passed off field pending pathology.  The hernia sac stalk reduced back into abdominal cavity  The vas and testicular vessels were identified and protected from harm.   Attention then turned to the floor of the canal, which was severely thinned.  Several interrupted 0 Ethibond sutures used to approximate the thicker transversalis aponeurosis to Cooper's ligament to reinforce the floor.  The Progrip mesh then inserted and secured to the pubic tubercle using interrupted 0 ethibond sutures. Care was taken to assure that the mesh was placed flat against the recreated floor and wrapped loosely around the cord structures.  Hemostasis was again checked. The Penrose drain was removed. Exparel infused as an ilioinguinal block.  The external oblique aponeurosis was closed with a running suture of 2-0 Vicryl. Scarpa's fascia was closed with interrupted 3-0 Vicryl.  The deep dermal layer closed with interrupted 3-0 Vicryl.  The skin was closed with a subcuticular stitch of Monocryl 4-0. Dermabond was applied.  The testis was gently pulled down into its anatomic position in the scrotum.  The patient tolerated the procedure well and was taken to the postanesthesia care unit in stable condition. Sponge and instrument count correct at end of procedure.

## 2023-04-28 NOTE — Discharge Instructions (Addendum)
Hernia repair, Care After This sheet gives you information about how to care for yourself after your procedure. Your health care provider may also give you more specific instructions. If you have problems or questions, contact your health care provider. What can I expect after the procedure? After your procedure, it is common to have the following: Pain in your abdomen, especially in the incision areas. You will be given medicine to control the pain. Tiredness. This is a normal part of the recovery process. Your energy level will return to normal over the next several weeks. Changes in your bowel movements, such as constipation or needing to go more often. Talk with your health care provider about how to manage this. Follow these instructions at home: Medicines  tylenol as needed for discomfort.   Use narcotics, if prescribed, only when tylenol  is not enough to control pain.  325-650mg  every 8hrs to max of 3000mg /24hrs (including the 325mg  in every norco dose) for the tylenol.   Resume Xarelto in 48 hours PLEASE RECORD NUMBER OF PILLS TAKEN UNTIL NEXT FOLLOW UP APPT.  THIS WILL HELP DETERMINE HOW READY YOU ARE TO BE RELEASED FROM ANY ACTIVITY RESTRICTIONS Do not drive or use heavy machinery while taking prescription pain medicine. Do not drink alcohol while taking prescription pain medicine.  Incision care    Follow instructions from your health care provider about how to take care of your incision areas. Make sure you: Keep your incisions clean and dry. Wash your hands with soap and water before and after applying medicine to the areas, and before and after changing your bandage (dressing). If soap and water are not available, use hand sanitizer. Change your dressing as told by your health care provider. Leave stitches (sutures), skin glue, or adhesive strips in place. These skin closures may need to stay in place for 2 weeks or longer. If adhesive strip edges start to loosen and curl up,  you may trim the loose edges. Do not remove adhesive strips completely unless your health care provider tells you to do that. Do not wear tight clothing over the incisions. Tight clothing may rub and irritate the incision areas, which may cause the incisions to open. Do not take baths, swim, or use a hot tub until your health care provider approves. OK TO SHOWER IN 24HRS.   Check your incision area every day for signs of infection. Check for: More redness, swelling, or pain. More fluid or blood. Warmth. Pus or a bad smell. Activity Avoid lifting anything that is heavier than 10 lb (4.5 kg) for 2 weeks or until your health care provider says it is okay. No pushing/pulling greater than 30lbs You may resume normal activities as told by your health care provider. Ask your health care provider what activities are safe for you. Take rest breaks during the day as needed. Eating and drinking Follow instructions from your health care provider about what you can eat after surgery. To prevent or treat constipation while you are taking prescription pain medicine, your health care provider may recommend that you: Drink enough fluid to keep your urine clear or pale yellow. Take over-the-counter or prescription medicines. Eat foods that are high in fiber, such as fresh fruits and vegetables, whole grains, and beans. Limit foods that are high in fat and processed sugars, such as fried and sweet foods. General instructions Ask your health care provider when you will need an appointment to get your sutures or staples removed. Keep all follow-up visits as  told by your health care provider. This is important. Contact a health care provider if: You have more redness, swelling, or pain around your incisions. You have more fluid or blood coming from the incisions. Your incisions feel warm to the touch. You have pus or a bad smell coming from your incisions or your dressing. You have a fever. You have an  incision that breaks open (edges not staying together) after sutures or staples have been removed. You develop a rash. You have chest pain or difficulty breathing. You have pain or swelling in your legs. You feel light-headed or you faint. Your abdomen swells (becomes distended). You have nausea or vomiting. You have blood in your stool (feces). This information is not intended to replace advice given to you by your health care provider. Make sure you discuss any questions you have with your health care provider. Document Released: 09/17/2004 Document Revised: 11/17/2017 Document Reviewed: 11/30/2015 Elsevier Interactive Patient Education  2019 [employer].

## 2023-04-28 NOTE — Transfer of Care (Signed)
Immediate Anesthesia Transfer of Care Note  Patient: Connor Mays  Procedure(s) Performed: HERNIA REPAIR INGUINAL ADULT (Right: Abdomen)  Patient Location: PACU  Anesthesia Type:General  Level of Consciousness: drowsy and patient cooperative  Airway & Oxygen Therapy: Patient Spontanous Breathing and Patient connected to face mask oxygen  Post-op Assessment: Report given to RN and Post -op Vital signs reviewed and stable  Post vital signs: Reviewed and stable  Last Vitals:  Vitals Value Taken Time  BP 140/75 04/28/23 1450  Temp    Pulse 66 04/28/23 1455  Resp 25 04/28/23 1455  SpO2 97 % 04/28/23 1455  Vitals shown include unfiled device data.  Last Pain:  Vitals:   04/28/23 1209  TempSrc: Temporal  PainSc: 0-No pain         Complications: No notable events documented.

## 2023-04-28 NOTE — Interval H&P Note (Signed)
No change. OK to proceed.

## 2023-04-28 NOTE — Anesthesia Preprocedure Evaluation (Addendum)
Anesthesia Evaluation  Patient identified by MRN, date of birth, ID band Patient awake    Reviewed: Allergy & Precautions, H&P , NPO status , Patient's Chart, lab work & pertinent test results  Airway Mallampati: III  TM Distance: >3 FB Neck ROM: full    Dental no notable dental hx.    Pulmonary sleep apnea , former smoker   Pulmonary exam normal        Cardiovascular Exercise Tolerance: Poor hypertension, + CAD, + Past MI, +CHF (HFpEF) and + DOE  + dysrhythmias Atrial Fibrillation  Rhythm:Regular Rate:Normal   CAD (coronary artery disease)     Comment:  a.) s/p MI (unk type) 10/1986; b.) s/p 2v CABG 02/1987               (LIMA-LAD, SVG-RCA); c.) s/p MI (unk type) 2007 -->               stents x 2 (unk type) to o-mRCA; d.) LHC 05/02/2017: 90%               o-pLCx, 75% oLAD, 100% pLAD, 50% oRI, 50/50% mRCA, 50/95%              dRCA, 100% post atrio. Patient RCA stents. SVG-RCA               occluded --> med mgmt   2/25: EKG Atrial flutter with variable A-V block Right axis deviation Incomplete right bundle branch block Right ventricular hypertrophy ST & T wave abnormality, consider anterolateral ischemia Abnormal ECG When compared with ECG of 01-Feb-2022 10:32, Incomplete right bundle branch block has replaced Non-specific intra-ventricular  2022 ECHO: NTERPRETATION  NORMAL LEFT VENTRICULAR SYSTOLIC FUNCTION   WITH MODERATE LVH  NORMAL RIGHT VENTRICULAR SYSTOLIC FUNCTION  NO VALVULAR STENOSIS  TRIVIAL MR  MILD TR  EF 50-55%     Neuro/Psych         cognitive impairmentRLS (restless legs syndrome)  Neuromuscular disease (Parkinson's disease)    GI/Hepatic Neg liver ROS,GERD  ,,  Endo/Other  diabetesHypothyroidism    Renal/GU      Musculoskeletal  (+) Arthritis ,    Abdominal Normal abdominal exam  (+)   Peds  Hematology negative hematology ROS (+)   Anesthesia Other Findings Past Medical History: No  date: (HFpEF) heart failure with preserved ejection fraction (HCC)     Comment:  a.) TTE 08/31/2017: EF > 55%, mild LVH, G2DD, mod LAE,               mild RAE, triv panval regurg; b.) TTE 10/07/2020: EF 50%,              mod LVH, sev LAE, mild RAE, RVSP 32.5, triv MR, mild TR No date: Acute deep vein thrombosis (DVT) of right peroneal vein (HCC) No date: Aortic atherosclerosis (HCC) No date: Arthritis No date: Atrial fibrillation and flutter (HCC)     Comment:  a.) CHA2DS2VASc = 8 (age x2, sex, HFpEF, HTN, DVT x2,               prior MI/vascular disease, T2DM) as of 04/26/2023; b.)               rate/rhythm maintained on oral carvedilol; chronically               anticoagulated with rivaroxaban No date: Bilateral lower extremity edema No date: BPH (benign prostatic hyperplasia) No date: CAD (coronary artery disease)     Comment:  a.) s/p MI (unk type) 10/1986; b.)  s/p 2v CABG 02/1987               (LIMA-LAD, SVG-RCA); c.) s/p MI (unk type) 2007 -->               stents x 2 (unk type) to o-mRCA; d.) LHC 05/02/2017: 90%               o-pLCx, 75% oLAD, 100% pLAD, 50% oRI, 50/50% mRCA, 50/95%              dRCA, 100% post atrio. Patient RCA stents. SVG-RCA               occluded --> med mgmt No date: Cerebral microvascular disease No date: CLL (chronic lymphocytic leukemia) (HCC) No date: Cognitive impairment No date: DOE (dyspnea on exertion) No date: GERD (gastroesophageal reflux disease) 2020: History of bilateral cataract extraction No date: Hyperlipidemia No date: Hypertension No date: Hypothyroidism No date: Incomplete RBBB No date: Lumbar spinal stenosis No date: Lumbar spondylolysis 05/23/2022: Malignant melanoma in situ (HCC)     Comment:  R lat tricep Lentigo Maligna type  - LN2 06/28/22               Imiquimod start 08/11/22 05/23/2022: Malignant melanoma in situ (HCC)     Comment:  R lat bicep Lentigo Maligna type  - LN2 06/28/22,               Imiquimod start  08/11/22 06/28/2022: Malignant melanoma in situ (HCC)     Comment:  L cheek - lentigo maligna type, LN2 07/07/22, Imiquimod               start 08/11/22 10/1986: Myocardial infarct [facility]) 2007: Myocardial infarct [facility])     Comment:  a.) s/p PCI with stents x 2 (unk type) o-mRCA No date: On rivaroxaban therapy No date: OSA (obstructive sleep apnea)     Comment:  a.) does not use nocturnal PAP therapy No date: Parkinson's disease (HCC) No date: Right inguinal hernia No date: RLS (restless legs syndrome)     Comment:  a.) on pramipexole 1988: S/P CABG x 2     Comment:  a.) [facility] in [place]; [place], SVG-RCA 08/11/2022: Squamous cell carcinoma of skin     Comment:  right dorsum hand, treated with EDC No date: T2DM (type 2 diabetes mellitus) (HCC) No date: Weakness of both lower extremities  Past Surgical History: 08/13/2018: CATARACT EXTRACTION W/PHACO; Right     Comment:  Procedure: CATARACT EXTRACTION PHACO AND INTRAOCULAR               LENS PLACEMENT (IOC) RIGHT;  Surgeon: [place] Crane,              MD;  Location: [facility];  Service:               Ophthalmology;  Laterality: Right; 09/17/2018: CATARACT EXTRACTION W/PHACO; Left     Comment:  Procedure: CATARACT EXTRACTION PHACO AND INTRAOCULAR               LENS PLACEMENT (IOC)  LEFT;  Surgeon: [place] Crane,              MD;  Location: [facility];  Service:               Ophthalmology;  Laterality: Left; No date: COLONOSCOPY W/ POLYPECTOMY 02/1987: CORONARY ARTERY BYPASS GRAFT; N/A     Comment:  Procedure: CORONARY ARTERY BYPASS GRAFT; Location: [facility] in [place] No date: cyst removed     Comment:  bottom No date: HERNIA REPAIR 10/1986: LEFT HEART CATH AND CORONARY ANGIOGRAPHY; Left 05/02/2017: LEFT HEART CATH AND CORS/GRAFTS ANGIOGRAPHY; N/A     Comment:  Procedure: LEFT HEART CATH AND CORS/GRAFTS ANGIOGRAPHY;               Surgeon: Alwyn Pea, MD;  Location: [facility]  INVASIVE              CV LAB;  Service: Cardiovascular;  Laterality: N/A; 2007: LEFT HEART CATH AND CORS/GRAFTS ANGIOGRAPHY AND STENT  INTERVENTION; Left No date: TONSILLECTOMY     Reproductive/Obstetrics negative OB ROS                             Anesthesia Physical Anesthesia Plan  ASA: 3  Anesthesia Plan: General LMA   Post-op Pain Management: Regional block*, Ofirmev IV (intra-op)* and Toradol IV (intra-op)*   Induction: Intravenous  PONV Risk Score and Plan: Dexamethasone and Ondansetron  Airway Management Planned: LMA  Additional Equipment:   Intra-op Plan:   Post-operative Plan: Extubation in OR  Informed Consent: I have reviewed the patients History and Physical, chart, labs and discussed the procedure including the risks, benefits and alternatives for the proposed anesthesia with the patient or authorized representative who has indicated his/her understanding and acceptance.     Dental Advisory Given  Plan Discussed with: Anesthesiologist, CRNA and Surgeon  Anesthesia Plan Comments:         Anesthesia Quick Evaluation

## 2023-04-28 NOTE — Anesthesia Procedure Notes (Signed)
Procedure Name: Intubation Date/Time: 04/28/2023 1:05 PM  Performed by: Lily Lovings, CRNAPre-anesthesia Checklist: Patient identified, Patient being monitored, Timeout performed, Emergency Drugs available and Suction available Patient Re-evaluated:Patient Re-evaluated prior to induction Oxygen Delivery Method: Circle system utilized Preoxygenation: Pre-oxygenation with 100% oxygen Induction Type: IV induction Ventilation: Mask ventilation without difficulty Laryngoscope Size: 3 and McGrath Grade View: Grade I Tube type: Oral Tube size: 7.0 mm Number of attempts: 1 Airway Equipment and Method: Stylet Placement Confirmation: ETT inserted through vocal cords under direct vision, positive ETCO2 and breath sounds checked- equal and bilateral Secured at: 23 cm Tube secured with: Tape Dental Injury: Teeth and Oropharynx as per pre-operative assessment

## 2023-05-01 ENCOUNTER — Encounter: Payer: Self-pay | Admitting: Surgery

## 2023-05-01 LAB — SURGICAL PATHOLOGY

## 2023-05-01 NOTE — Anesthesia Postprocedure Evaluation (Signed)
 Anesthesia Post Note  Patient: Connor Mays  Procedure(s) Performed: HERNIA REPAIR INGUINAL ADULT (Right: Abdomen)  Patient location during evaluation: PACU Anesthesia Type: General Level of consciousness: awake and alert Pain management: pain level controlled Vital Signs Assessment: post-procedure vital signs reviewed and stable Respiratory status: spontaneous breathing, nonlabored ventilation, respiratory function stable and patient connected to nasal cannula oxygen Cardiovascular status: blood pressure returned to baseline and stable Postop Assessment: no apparent nausea or vomiting Anesthetic complications: no   No notable events documented.   Last Vitals:  Vitals:   04/28/23 1545 04/28/23 1605  BP: 133/72 (!) 140/76  Pulse: 68 69  Resp: 20 18  Temp: 36.6 C 36.7 C  SpO2: 94% 97%    Last Pain:  Vitals:   04/29/23 1721  TempSrc:   PainSc: 0-No pain                 Cleda Mccreedy Wilfred Siverson

## 2023-07-20 ENCOUNTER — Ambulatory Visit: Payer: Medicare HMO | Admitting: Dermatology

## 2023-07-20 ENCOUNTER — Encounter: Payer: Self-pay | Admitting: Dermatology

## 2023-07-20 DIAGNOSIS — L820 Inflamed seborrheic keratosis: Secondary | ICD-10-CM | POA: Diagnosis not present

## 2023-07-20 DIAGNOSIS — L570 Actinic keratosis: Secondary | ICD-10-CM

## 2023-07-20 DIAGNOSIS — L578 Other skin changes due to chronic exposure to nonionizing radiation: Secondary | ICD-10-CM

## 2023-07-20 DIAGNOSIS — L821 Other seborrheic keratosis: Secondary | ICD-10-CM

## 2023-07-20 DIAGNOSIS — D229 Melanocytic nevi, unspecified: Secondary | ICD-10-CM

## 2023-07-20 DIAGNOSIS — D1801 Hemangioma of skin and subcutaneous tissue: Secondary | ICD-10-CM

## 2023-07-20 DIAGNOSIS — Z1283 Encounter for screening for malignant neoplasm of skin: Secondary | ICD-10-CM

## 2023-07-20 DIAGNOSIS — W908XXA Exposure to other nonionizing radiation, initial encounter: Secondary | ICD-10-CM | POA: Diagnosis not present

## 2023-07-20 DIAGNOSIS — L814 Other melanin hyperpigmentation: Secondary | ICD-10-CM

## 2023-07-20 NOTE — Progress Notes (Signed)
   Follow-Up Visit   Subjective  Connor Mays is a 89 y.o. male who presents for the following: 4 months Skin Cancer Screening and Upper Body Skin Exam, recheck face and arms hx of precancers   The patient presents for Upper Body Skin Exam (UBSE) for skin cancer screening and mole check. The patient has spots, moles and lesions to be evaluated, some may be new or changing and the patient may have concern these could be cancer.  daughter is with patient and contributes to history.   The following portions of the chart were reviewed this encounter and updated as appropriate: medications, allergies, medical history  Review of Systems:  No other skin or systemic complaints except as noted in HPI or Assessment and Plan.  Objective  Well appearing patient in no apparent distress; mood and affect are within normal limits.  All skin waist up examined. Relevant physical exam findings are noted in the Assessment and Plan.  face,ears (7) Erythematous thin papules/macules with gritty scale.  arms x 19 (19) Stuck-on, waxy, tan-brown papules and plaques -- Discussed benign etiology and prognosis.   Assessment & Plan   AK (ACTINIC KERATOSIS) (7) face,ears (7) ACTINIC DAMAGE - chronic, secondary to cumulative UV radiation exposure/sun exposure over time - diffuse scaly erythematous macules with underlying dyspigmentation - Recommend daily broad spectrum sunscreen SPF 30+ to sun-exposed areas, reapply every 2 hours as needed.  - Recommend staying in the shade or wearing long sleeves, sun glasses (UVA+UVB protection) and wide brim hats (4-inch brim around the entire circumference of the hat). - Call for new or changing lesions.  Destruction of lesion - face,ears (7) Complexity: simple   Destruction method: cryotherapy   Informed consent: discussed and consent obtained   Timeout:  patient name, date of birth, surgical site, and procedure verified Lesion destroyed using liquid nitrogen: Yes    Region frozen until ice ball extended beyond lesion: Yes   Outcome: patient tolerated procedure well with no complications   Post-procedure details: wound care instructions given   INFLAMED SEBORRHEIC KERATOSIS (19) arms x 19 (19) Symptomatic, irritating, patient would like treated.  Destruction of lesion - arms x 19 (19) Complexity: simple   Destruction method: cryotherapy   Informed consent: discussed and consent obtained   Timeout:  patient name, date of birth, surgical site, and procedure verified Lesion destroyed using liquid nitrogen: Yes   Region frozen until ice ball extended beyond lesion: Yes   Outcome: patient tolerated procedure well with no complications   Post-procedure details: wound care instructions given    Skin cancer screening performed today.  Lentigines, Seborrheic Keratoses, Hemangiomas - Benign normal skin lesions - Benign-appearing - Call for any changes  Melanocytic Nevi - Tan-brown and/or pink-flesh-colored symmetric macules and papules - Benign appearing on exam today - Observation - Call clinic for new or changing moles - Recommend daily use of broad spectrum spf 30+ sunscreen to sun-exposed areas.   Return in about 1 year (around 07/19/2024) for TBSE, hx of Aks .  IClara Crisp, CMA, am acting as scribe for Celine Collard, MD .   Documentation: I have reviewed the above documentation for accuracy and completeness, and I agree with the above.  Celine Collard, MD

## 2023-07-20 NOTE — Patient Instructions (Signed)

## 2023-07-24 ENCOUNTER — Other Ambulatory Visit: Payer: Self-pay | Admitting: Urology

## 2023-07-31 ENCOUNTER — Other Ambulatory Visit: Payer: Self-pay | Admitting: Urology

## 2023-08-03 ENCOUNTER — Other Ambulatory Visit: Payer: Self-pay | Admitting: Internal Medicine

## 2023-08-03 DIAGNOSIS — R600 Localized edema: Secondary | ICD-10-CM

## 2023-08-03 DIAGNOSIS — M79604 Pain in right leg: Secondary | ICD-10-CM

## 2023-08-04 ENCOUNTER — Ambulatory Visit
Admission: RE | Admit: 2023-08-04 | Discharge: 2023-08-04 | Disposition: A | Discharge status: Home / Self Care | Source: Ambulatory Visit | Attending: Internal Medicine | Admitting: Internal Medicine

## 2023-08-04 DIAGNOSIS — R600 Localized edema: Secondary | ICD-10-CM | POA: Insufficient documentation

## 2023-08-04 DIAGNOSIS — M79605 Pain in left leg: Secondary | ICD-10-CM | POA: Diagnosis present

## 2023-08-04 DIAGNOSIS — M79604 Pain in right leg: Secondary | ICD-10-CM | POA: Insufficient documentation

## 2023-08-14 ENCOUNTER — Ambulatory Visit (INDEPENDENT_AMBULATORY_CARE_PROVIDER_SITE_OTHER): Admitting: Vascular Surgery

## 2023-08-14 ENCOUNTER — Encounter (INDEPENDENT_AMBULATORY_CARE_PROVIDER_SITE_OTHER): Payer: Self-pay | Admitting: Vascular Surgery

## 2023-08-14 VITALS — BP 146/62 | HR 54 | Resp 18 | Ht 68.0 in | Wt 221.0 lb

## 2023-08-14 DIAGNOSIS — I10 Essential (primary) hypertension: Secondary | ICD-10-CM

## 2023-08-14 DIAGNOSIS — E7800 Pure hypercholesterolemia, unspecified: Secondary | ICD-10-CM | POA: Diagnosis not present

## 2023-08-14 DIAGNOSIS — I890 Lymphedema, not elsewhere classified: Secondary | ICD-10-CM

## 2023-08-14 NOTE — Progress Notes (Signed)
 Subjective:    Patient ID: Connor Mays, male    DOB: 1930/05/24, 89 y.o.   MRN: [id number] Chief Complaint  Patient presents with   New Patient (Initial Visit)    np. consult. bilateral leg edema. pneumatic compression device. Connor Mays.    Connor Mays is a 89 year old male presents to clinic with chief complaint of bilateral lower extremity leg swelling.  Patient states that he is on 2 medications to help with his swelling but they do not seem to be working for his legs anymore.  In looking at those medications they are spironolactone and torsemide.  Patient also endorses that he has not been compliant with compression, rest, elevation and exercise.  He is currently wearing a pair of compression socks that are old and do not appear to be having any effect on the swelling of his lower legs.  He would like a recommendation as to what he may be able to do to help reduce his swelling.  Patient also describes pain in a band tightening of his left leg as he walks which he has not had in the past.  He endorses that if he walks slumped over a cart in the grocery store the pain to his legs gets better.  This is a symptom of spinal stenosis.  We did discuss this with arthritis related to his age today.  He believes he is not a candidate for any surgery due to his age.    Review of Systems  Constitutional: Negative.   Respiratory: Negative.    Cardiovascular: Negative.   Musculoskeletal:  Positive for joint swelling.  All other systems reviewed and are negative.      Objective:    Physical Exam Vitals reviewed.  Constitutional:      Appearance: Normal appearance. He is normal weight.  HENT:     Head: Normocephalic.  Eyes:     Pupils: Pupils are equal, round, and reactive to light.  Cardiovascular:     Rate and Rhythm: Normal rate and regular rhythm.     Pulses: Normal pulses.     Heart sounds: Normal heart sounds.  Pulmonary:     Effort: Pulmonary effort is normal.     Breath  sounds: Normal breath sounds.  Abdominal:     General: Abdomen is flat. Bowel sounds are normal.     Palpations: Abdomen is soft.  Musculoskeletal:        General: Tenderness present.     Right lower leg: Edema present.     Left lower leg: Edema present.  Skin:    General: Skin is warm and dry.     Capillary Refill: Capillary refill takes 2 to 3 seconds.  Neurological:     General: No focal deficit present.     Mental Status: He is alert and oriented to person, place, and time. Mental status is at baseline.  Psychiatric:        Mood and Affect: Mood normal.        Behavior: Behavior normal.        Thought Content: Thought content normal.        Judgment: Judgment normal.     BP (!) 146/62   Pulse (!) 54   Resp 18   Ht 5\' 8"  (1.727 m)   Wt 221 lb (100.2 kg)   BMI 33.60 kg/m   Past Medical History:  Diagnosis Date   (HFpEF) heart failure with preserved ejection fraction (HCC)    a.) TTE 08/31/2017: EF >  55%, mild LVH, G2DD, mod LAE, mild RAE, triv panval regurg; b.) TTE 10/07/2020: EF 50%, mod LVH, sev LAE, mild RAE, RVSP 32.5, triv MR, mild TR   Acute deep vein thrombosis (DVT) of right peroneal vein (HCC)    Aortic atherosclerosis (HCC)    Arthritis    Atrial fibrillation and flutter (HCC)    a.) CHA2DS2VASc = 8 (age x2, sex, HFpEF, HTN, DVT x2, prior MI/vascular disease, T2DM) as of 04/26/2023; b.) rate/rhythm maintained on oral carvedilol; chronically anticoagulated with rivaroxaban   Bilateral lower extremity edema    BPH (benign prostatic hyperplasia)    CAD (coronary artery disease)    a.) s/p MI (unk type) 10/1986; b.) s/p 2v CABG 02/1987 (LIMA-LAD, SVG-RCA); c.) s/p MI (unk type) 2007 --> stents x 2 (unk type) to o-mRCA; d.) LHC 05/02/2017: 90% o-pLCx, 75% oLAD, 100% pLAD, 50% oRI, 50/50% mRCA, 50/95% dRCA, 100% post atrio. Patient RCA stents. 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 in [place]; LIMA-LAD, SVG-RCA   Squamous cell carcinoma of skin 08/11/2022   right dorsum hand, treated with EDC   T2DM (type 2 diabetes mellitus) (HCC)    Weakness of both lower extremities     Social History   Socioeconomic History   Marital status: Widowed    Spouse name: Not on file   Number of children: Not on file   Years of education: Not on file   Highest education level: Not on file  Occupational History   Not on file  Tobacco Use   Smoking status: Former    Types: Cigars   Smokeless tobacco: Former    Quit date: 1970   Tobacco comments:    quit over 40 yrs ago  Vaping Use   Vaping status: Never Used  Substance and Sexual Activity   Alcohol use: No    Comment: may have a drink 1-2x/yr   Drug use: No   Sexual activity: Not on file  Other Topics Concern   Not on file  Social History Narrative   Daughter lives with father, assist in care giving   Social Drivers of Health   Financial Resource  Strain: Not on file  Food Insecurity: Not on file  Transportation Needs: Not on  file  Physical Activity: Not on file  Stress: Not on file  Social Connections: Not on file  Intimate Partner Violence: Not on file    Past Surgical History:  Procedure Laterality Date   CATARACT EXTRACTION W/PHACO Right 08/13/2018   Procedure: CATARACT EXTRACTION PHACO AND INTRAOCULAR LENS PLACEMENT (IOC) RIGHT;  Surgeon: Rosa College, MD;  Location: [facility];  Service: Ophthalmology;  Laterality: Right;   CATARACT EXTRACTION W/PHACO Left 09/17/2018   Procedure: CATARACT EXTRACTION PHACO AND INTRAOCULAR LENS PLACEMENT (IOC)  LEFT;  Surgeon: Rosa College, MD;  Location: [facility];  Service: Ophthalmology;  Laterality: Left;   COLONOSCOPY W/ POLYPECTOMY     CORONARY ARTERY BYPASS GRAFT N/A 02/1987   Procedure: CORONARY ARTERY BYPASS GRAFT; Location: [facility] in [place]    cyst removed     bottom   HERNIA REPAIR     INGUINAL HERNIA REPAIR Right 04/28/2023   Procedure: HERNIA REPAIR INGUINAL ADULT;  Surgeon: Conrado Delay, DO;  Location: [facility];  Service: General;  Laterality: Right;   LEFT HEART CATH AND CORONARY ANGIOGRAPHY Left 10/1986   LEFT HEART CATH AND CORS/GRAFTS ANGIOGRAPHY N/A 05/02/2017   Procedure: LEFT HEART CATH AND CORS/GRAFTS ANGIOGRAPHY;  Surgeon: Antonette Batters, MD;  Location: [facility];  Service: Cardiovascular;  Laterality: N/A;   LEFT HEART CATH AND CORS/GRAFTS ANGIOGRAPHY AND STENT INTERVENTION Left 2007   TONSILLECTOMY      Family History  Problem Relation Age of Onset   Healthy Sister    Bladder Cancer Brother    Healthy Sister     Allergies  Allergen Reactions   Morphine Other (See Comments)    Pre-syncope       Latest Ref Rng & Units 04/06/2023    7:42 PM 02/01/2022   10:35 AM 01/18/2021    9:09 AM  CBC  WBC 4.0 - 10.5 K/uL 15.2  14.6  12.4   Hemoglobin 13.0 - 17.0 g/dL 16.1  09.6  04.5   Hematocrit 39.0 - 52.0 % 44.2   37.9  41.8   Platelets 150 - 400 K/uL 180  116  133        CMP     Component Value Date/Time   NA 141 04/06/2023 1942   K 4.1 04/06/2023 1942   CL 103 04/06/2023 1942   CO2 25 04/06/2023 1942   GLUCOSE 165 (H) 04/06/2023 1942   BUN 33 (H) 04/06/2023 1942   CREATININE 1.18 04/06/2023 1942   CALCIUM 9.3 04/06/2023 1942   PROT 6.8 04/06/2023 1942   ALBUMIN 4.2 04/06/2023 1942   AST 16 04/06/2023 1942   ALT 9 04/06/2023 1942   ALKPHOS 52 04/06/2023 1942   BILITOT 0.9 04/06/2023 1942   GFRNONAA 59 (L) 04/06/2023 1942     No results found.     Assessment & Plan:   1. Lymphedema (Primary) Recommend:  I have had a long discussion with the patient regarding swelling and why it  causes symptoms.  Patient will begin wearing graduated compression on a daily basis a prescription was given. The patient will  wear the stockings first thing in the morning and removing them in the evening. The patient is instructed specifically not to sleep in the stockings.   In addition, behavioral modification will be initiated.  This will include frequent elevation, use of over the counter pain medications and exercise such as walking.  Consideration for a lymph pump will also be made based upon the effectiveness of conservative  therapy.  This would help to improve the edema control and prevent sequela such as ulcers and infections   Patient should undergo duplex ultrasound of the venous system to ensure that DVT or reflux is not present.  The patient will follow-up with me after the ultrasound.   I also believe the pain to his left lower extremity is neurogenic in nature.  I offered to put in a consult to neurosurgery to assess him for possible spinal stenosis.  He would not like to do this at this time he would like to try conservative measures for the swelling in his lower extremities before pursuing any other options.  2. Essential hypertension Continue antihypertensive medications as already  ordered, these medications have been reviewed and there are no changes at this time.  3. Pure hypercholesterolemia Continue statin as ordered and reviewed, no changes at this time   Current Outpatient Medications on File Prior to Visit  Medication Sig Dispense Refill   Ascorbic Acid (VITAMIN C) 1000 MG tablet Take 1,000 mg by mouth daily.      Calcium Magnesium Zinc 333-133-5 MG TABS 1 tablet with meals for health maintenance Orally once a day     carbidopa-levodopa (SINEMET IR) 25-100 MG tablet Take 2 tablets by mouth 3 (three) times daily.     carvedilol (COREG) 6.25 MG tablet Take 6.25 mg by mouth 2 (two) times daily with a meal.     chlorpheniramine (CHLOR-TRIMETON) 4 MG tablet 1 tablet as needed for allergies per patient rarely takes Orally as needed     Cholecalciferol (VITAMIN D3) 1000 units CAPS Take 1,000 Units by mouth daily.      Cyanocobalamin (B-12 PO) Take 1 tablet by mouth daily.     dabigatran (PRADAXA) 150 MG CAPS capsule Take 150 mg by mouth 2 (two) times daily.     finasteride  (PROSCAR ) 5 MG tablet Take 1 tablet (5 mg total) by mouth daily. 90 tablet 3   levothyroxine (SYNTHROID, LEVOTHROID) 50 MCG tablet Take 50 mcg by mouth daily before breakfast.      losartan (COZAAR) 100 MG tablet Take 100 mg by mouth daily.      MAGNESIUM PO Take 1 tablet by mouth daily.     Multiple Vitamins-Minerals (PRESERVISION AREDS PO) Take 1 tablet by mouth in the morning and at bedtime.     nystatin (MYCOSTATIN/NYSTOP) powder 1 application for fungal rash until cleared Externally Twice a day for 30 days     pramipexole (MIRAPEX) 1 MG tablet Take 1 mg by mouth 3 (three) times daily.     simvastatin (ZOCOR) 40 MG tablet Take 40 mg by mouth at bedtime.     spironolactone (ALDACTONE) 25 MG tablet Take 12.5 mg by mouth daily.     torsemide (DEMADEX) 20 MG tablet Take 20 mg by mouth 3 (three) times a week.     traMADol  (ULTRAM ) 50 MG tablet Take 1 tablet (50 mg total) by mouth every 6 (six) hours  as needed. 20 tablet 0   XARELTO 20 MG TABS tablet Take 1 tablet by mouth daily.     No current facility-administered medications on file prior to visit.    There are no Patient Instructions on file for this visit. No follow-ups on file.   Annamaria Barrette, NP

## 2023-08-15 ENCOUNTER — Encounter (INDEPENDENT_AMBULATORY_CARE_PROVIDER_SITE_OTHER): Payer: Self-pay | Admitting: Vascular Surgery

## 2023-09-11 ENCOUNTER — Encounter: Payer: Self-pay | Admitting: Dermatology

## 2023-09-12 ENCOUNTER — Encounter: Payer: Self-pay | Admitting: Dermatology

## 2023-09-12 ENCOUNTER — Ambulatory Visit: Admitting: Dermatology

## 2023-09-12 DIAGNOSIS — L578 Other skin changes due to chronic exposure to nonionizing radiation: Secondary | ICD-10-CM

## 2023-09-12 DIAGNOSIS — C44622 Squamous cell carcinoma of skin of right upper limb, including shoulder: Secondary | ICD-10-CM | POA: Diagnosis not present

## 2023-09-12 DIAGNOSIS — D492 Neoplasm of unspecified behavior of bone, soft tissue, and skin: Secondary | ICD-10-CM

## 2023-09-12 DIAGNOSIS — W908XXA Exposure to other nonionizing radiation, initial encounter: Secondary | ICD-10-CM | POA: Diagnosis not present

## 2023-09-12 DIAGNOSIS — D489 Neoplasm of uncertain behavior, unspecified: Secondary | ICD-10-CM

## 2023-09-12 DIAGNOSIS — C4492 Squamous cell carcinoma of skin, unspecified: Secondary | ICD-10-CM

## 2023-09-12 HISTORY — DX: Squamous cell carcinoma of skin, unspecified: C44.92

## 2023-09-12 NOTE — Progress Notes (Signed)
   Follow-Up Visit   Subjective  Connor Mays is a 89 y.o. male who presents for the following: patient here concerning a spot he noticed that is sore on his right elbow. Had sent mychart messsage along with photos. Dr. Kowalsi recommended spot biopsied for further evaluation.  The patient has spots, moles and lesions to be evaluated, some may be new or changing and the patient may have concern these could be cancer.  The following portions of the chart were reviewed this encounter and updated as appropriate: medications, allergies, medical history  Review of Systems:  No other skin or systemic complaints except as noted in HPI or Assessment and Plan.  Objective  Well appearing patient in no apparent distress; mood and affect are within normal limits.  A focused examination was performed of the following areas: Right elbow   Relevant exam findings are noted in the Assessment and Plan.  Right Elbow - Posterior 1.2 cm keratotic papule    Assessment & Plan   NEOPLASM OF UNCERTAIN BEHAVIOR Right Elbow - Posterior Epidermal / dermal shaving  Lesion diameter (cm):  1.2 Informed consent: discussed and consent obtained   Timeout: patient name, date of birth, surgical site, and procedure verified   Procedure prep:  Patient was prepped and draped in usual sterile fashion Prep type:  Isopropyl alcohol Anesthesia: the lesion was anesthetized in a standard fashion   Anesthetic:  1% lidocaine  w/ epinephrine  1-100,000 buffered w/ 8.4% NaHCO3 Instrument used: flexible razor blade   Hemostasis achieved with: pressure, aluminum chloride and electrodesiccation   Outcome: patient tolerated procedure well   Post-procedure details: sterile dressing applied and wound care instructions given   Dressing type: bandage and petrolatum    Destruction of lesion Complexity: extensive   Destruction method: electrodesiccation and curettage   Informed consent: discussed and consent obtained   Timeout:   patient name, date of birth, surgical site, and procedure verified Procedure prep:  Patient was prepped and draped in usual sterile fashion Prep type:  Isopropyl alcohol Anesthesia: the lesion was anesthetized in a standard fashion   Anesthetic:  1% lidocaine  w/ epinephrine  1-100,000 buffered w/ 8.4% NaHCO3 Curettage performed in three different directions: Yes   Electrodesiccation performed over the curetted area: Yes   Final wound size (cm):  1.2 Hemostasis achieved with:  pressure, aluminum chloride and electrodesiccation Outcome: patient tolerated procedure well with no complications   Post-procedure details: sterile dressing applied and wound care instructions given   Dressing type: bandage and petrolatum   Specimen 1 - Surgical pathology Differential Diagnosis: r/o scc ED&C   Check Margins: no Shv and edc  ACTINIC SKIN DAMAGE   SCC (SQUAMOUS CELL CARCINOMA), ARM, RIGHT    ACTINIC DAMAGE - chronic, secondary to cumulative UV radiation exposure/sun exposure over time - diffuse scaly erythematous macules with underlying dyspigmentation - Recommend daily broad spectrum sunscreen SPF 30+ to sun-exposed areas, reapply every 2 hours as needed.  - Recommend staying in the shade or wearing long sleeves, sun glasses (UVA+UVB protection) and wide brim hats (4-inch brim around the entire circumference of the hat). - Call for new or changing lesions.  Return for keep follow up as scheduled in March .  IEleanor Blush, CMA, am acting as scribe for Alm Rhyme, MD.   Documentation: I have reviewed the above documentation for accuracy and completeness, and I agree with the above.  Alm Rhyme, MD

## 2023-09-12 NOTE — Patient Instructions (Addendum)
Electrodesiccation and Curettage ("Scrape and Burn") Wound Care Instructions  Leave the original bandage on for 24 hours if possible.  If the bandage becomes soaked or soiled before that time, it is OK to remove it and examine the wound.  A small amount of post-operative bleeding is normal.  If excessive bleeding occurs, remove the bandage, place gauze over the site and apply continuous pressure (no peeking) over the area for 30 minutes. If this does not work, please call our clinic as soon as possible or page your doctor if it is after hours.   Once a day, cleanse the wound with soap and water. It is fine to shower. If a thick crust develops you may use a Q-tip dipped into dilute hydrogen peroxide (mix 1:1 with water) to dissolve it.  Hydrogen peroxide can slow the healing process, so use it only as needed.    After washing, apply petroleum jelly (Vaseline) or an antibiotic ointment if your doctor prescribed one for you, followed by a bandage.    For best healing, the wound should be covered with a layer of ointment at all times. If you are not able to keep the area covered with a bandage to hold the ointment in place, this may mean re-applying the ointment several times a day.  Continue this wound care until the wound has healed and is no longer open. It may take several weeks for the wound to heal and close.  Itching and mild discomfort is normal during the healing process.  If you have any discomfort, you can take Tylenol (acetaminophen) or ibuprofen as directed on the bottle. (Please do not take these if you have an allergy to them or cannot take them for another reason).  Some redness, tenderness and white or yellow material in the wound is normal healing.  If the area becomes very sore and red, or develops a thick yellow-green material (pus), it may be infected; please notify us.    Wound healing continues for up to one year following surgery. It is not unusual to experience pain in the scar  from time to time during the interval.  If the pain becomes severe or the scar thickens, you should notify the office.    A slight amount of redness in a scar is expected for the first six months.  After six months, the redness will fade and the scar will soften and fade.  The color difference becomes less noticeable with time.  If there are any problems, return for a post-op surgery check at your earliest convenience.  To improve the appearance of the scar, you can use silicone scar gel, cream, or sheets (such as Mederma or Serica) every night for up to one year. These are available over the counter (without a prescription).  Please call our office at [phone] for any questions or concerns.   Biopsy Wound Care Instructions  Leave the original bandage on for 24 hours if possible.  If the bandage becomes soaked or soiled before that time, it is OK to remove it and examine the wound.  A small amount of post-operative bleeding is normal.  If excessive bleeding occurs, remove the bandage, place gauze over the site and apply continuous pressure (no peeking) over the area for 30 minutes. If this does not work, please call our clinic as soon as possible or page your doctor if it is after hours.   Once a day, cleanse the wound with soap and water. It is fine to shower.  If a thick crust develops you may use a Q-tip dipped into dilute hydrogen peroxide (mix 1:1 with water) to dissolve it.  Hydrogen peroxide can slow the healing process, so use it only as needed.    After washing, apply petroleum jelly (Vaseline) or an antibiotic ointment if your doctor prescribed one for you, followed by a bandage.    For best healing, the wound should be covered with a layer of ointment at all times. If you are not able to keep the area covered with a bandage to hold the ointment in place, this may mean re-applying the ointment several times a day.  Continue this wound care until the wound has healed and is no longer  open.   Itching and mild discomfort is normal during the healing process. However, if you develop pain or severe itching, please call our office.   If you have any discomfort, you can take Tylenol (acetaminophen) or ibuprofen as directed on the bottle. (Please do not take these if you have an allergy to them or cannot take them for another reason).  Some redness, tenderness and white or yellow material in the wound is normal healing.  If the area becomes very sore and red, or develops a thick yellow-green material (pus), it may be infected; please notify us.    If you have stitches, return to clinic as directed to have the stitches removed. You will continue wound care for 2-3 days after the stitches are removed.   Wound healing continues for up to one year following surgery. It is not unusual to experience pain in the scar from time to time during the interval.  If the pain becomes severe or the scar thickens, you should notify the office.    A slight amount of redness in a scar is expected for the first six months.  After six months, the redness will fade and the scar will soften and fade.  The color difference becomes less noticeable with time.  If there are any problems, return for a post-op surgery check at your earliest convenience.  To improve the appearance of the scar, you can use silicone scar gel, cream, or sheets (such as Mederma or Serica) every night for up to one year. These are available over the counter (without a prescription).  Please call our office at [phone] for any questions or concerns.       Due to recent changes in healthcare laws, you may see results of your pathology and/or laboratory studies on MyChart before the doctors have had a chance to review them. We understand that in some cases there may be results that are confusing or concerning to you. Please understand that not all results are received at the same time and often the doctors may need to interpret  multiple results in order to provide you with the best plan of care or course of treatment. Therefore, we ask that you please give [place] 2 business days to thoroughly review all your results before contacting the office for clarification. Should we see a critical lab result, you will be contacted sooner.   If You Need Anything After Your Visit  If you have any questions or concerns for your doctor, please call our main line at [phone] and press option 4 to reach your doctor's medical assistant. If no one answers, please leave a voicemail as directed and we will return your call as soon as possible. Messages left after 4 pm will be answered the following business day.  You may also send [place] a message via MyChart. We typically respond to MyChart messages within 1-2 business days.  For prescription refills, please ask your pharmacy to contact our office. Our fax number is [phone].  If you have an urgent issue when the clinic is closed that cannot wait until the next business day, you can page your doctor at the number below.    Please note that while we do our best to be available for urgent issues outside of office hours, we are not available 24/7.   If you have an urgent issue and are unable to reach [place], you may choose to seek medical care at your doctor's office, retail clinic, urgent care center, or emergency room.  If you have a medical emergency, please immediately call 911 or go to the emergency department.  Pager Numbers  - Dr. Gwen Pounds: [phone]  - Dr. Roseanne Reno: [phone]  - Dr. Katrinka Blazing: [phone]   In the event of inclement weather, please call our main line at [phone] for an update on the status of any delays or closures.  Dermatology Medication Tips: Please keep the boxes that topical medications come in in order to help keep track of the instructions about where and how to use these. Pharmacies typically print the medication instructions only on the boxes  and not directly on the medication tubes.   If your medication is too expensive, please contact our office at [phone] option 4 or send [place] a message through MyChart.   We are unable to tell what your co-pay for medications will be in advance as this is different depending on your insurance coverage. However, we may be able to find a substitute medication at lower cost or fill out paperwork to get insurance to cover a needed medication.   If a prior authorization is required to get your medication covered by your insurance company, please allow [place] 1-2 business days to complete this process.  Drug prices often vary depending on where the prescription is filled and some pharmacies may offer cheaper prices.  The website www.goodrx.com contains coupons for medications through different pharmacies. The prices here do not account for what the cost may be with help from insurance (it may be cheaper with your insurance), but the website can give you the price if you did not use any insurance.  - You can print the associated coupon and take it with your prescription to the pharmacy.  - You may also stop by our office during regular business hours and pick up a GoodRx coupon card.  - If you need your prescription sent electronically to a different pharmacy, notify our office through [facility] or by phone at [phone] option 4.     Si Usted Necesita Algo Despus de Su Visita  Tambin puede enviarnos un mensaje a travs de Clinical cytogeneticist. Por lo general respondemos a los mensajes de MyChart en el transcurso de 1 a 2 das hbiles.  Para renovar recetas, por favor pida a su farmacia que se ponga en contacto con nuestra oficina. Annie Sable de fax es [place] [phone].  Si tiene un asunto urgente cuando la clnica est cerrada y que no puede esperar hasta el siguiente da hbil, puede llamar/localizar a su doctor(a) al nmero que aparece a continuacin.   Por favor, tenga en cuenta que aunque  hacemos todo lo posible para estar disponibles para asuntos urgentes fuera del horario de [place], no estamos disponibles las 24 horas del da, los 7 [address]  [address] de la [place].  Si tiene un problema urgente y no puede comunicarse con nosotros, puede optar por buscar atencin mdica  en el consultorio de su doctor(a), en una clnica privada, en un centro de atencin urgente o en una sala de emergencias.  Si tiene Engineer, drilling, por favor llame inmediatamente al 911 o vaya a la sala de emergencias.  Nmeros de bper  - Dr. Gwen Pounds: [phone]  - Dra. Roseanne Reno: [phone]  - Dr. Katrinka Blazing: [phone]   En caso de inclemencias del tiempo, por favor llame a Lacy Duverney principal al [phone] para una actualizacin sobre el [place] de cualquier retraso o cierre.  Consejos para la medicacin en dermatologa: Por favor, guarde las cajas en las que vienen los medicamentos de uso tpico para ayudarle a seguir las instrucciones sobre dnde y cmo usarlos. Las farmacias generalmente imprimen las instrucciones del medicamento slo en las cajas y no directamente en los tubos del [place].   Si su medicamento es muy caro, por favor, pngase en contacto con Rolm Gala llamando al [phone] y presione la opcin 4 o envenos un mensaje a travs de Clinical cytogeneticist.   No podemos decirle cul ser su copago por los medicamentos por adelantado ya que esto es diferente dependiendo de la cobertura de su seguro. Sin embargo, es posible que podamos encontrar un medicamento sustituto a Audiological scientist un formulario para que el seguro cubra el medicamento que se considera necesario.   Si se requiere una autorizacin previa para que su compaa de seguros [place] su medicamento, por favor permtanos de 1 a 2 das hbiles para completar [address]  Los precios de los medicamentos varan con frecuencia dependiendo del Environmental consultant de dnde se surte la receta y alguna farmacias pueden ofrecer precios ms  baratos.  El sitio web www.goodrx.com tiene cupones para medicamentos de Health and safety inspector. Los precios aqu no tienen en cuenta lo que podra costar con la ayuda del seguro (puede ser ms barato con su seguro), pero el sitio web puede darle el precio si no utiliz Tourist information centre manager.  - Puede imprimir el cupn correspondiente y llevarlo con su receta a la farmacia.  - Tambin puede pasar por nuestra oficina durante el horario de atencin regular y Education officer, museum una tarjeta de cupones de GoodRx.  - Si necesita que su receta se enve electrnicamente a una farmacia diferente, informe a nuestra oficina a travs de MyChart de [place] o por telfono llamando al [phone] y presione la opcin 4.

## 2023-09-13 LAB — SURGICAL PATHOLOGY

## 2023-09-18 ENCOUNTER — Encounter: Payer: Self-pay | Admitting: Dermatology

## 2023-09-18 ENCOUNTER — Ambulatory Visit: Payer: Self-pay | Admitting: Dermatology

## 2023-09-18 NOTE — Telephone Encounter (Addendum)
 Called and discussed results with patient. He verbalized understanding and denied further questions. Will recheck at next follow up  ----- Message from Alm Rhyme sent at 09/18/2023  5:24 PM EDT ----- FINAL DIAGNOSIS        1. Skin, right elbow - posterior :       WELL DIFFERENTIATED SQUAMOUS CELL CARCINOMA   Cancer = SCC Already treated Recheck next visit ----- Message ----- From: Interface, Lab In Three Zero One Sent: 09/13/2023   6:14 PM EDT To: Alm JAYSON Rhyme, MD

## 2023-09-22 ENCOUNTER — Encounter: Payer: Self-pay | Admitting: Dermatology

## 2023-11-02 ENCOUNTER — Other Ambulatory Visit (INDEPENDENT_AMBULATORY_CARE_PROVIDER_SITE_OTHER): Payer: Self-pay | Admitting: Vascular Surgery

## 2023-11-02 DIAGNOSIS — M7989 Other specified soft tissue disorders: Secondary | ICD-10-CM

## 2023-11-02 DIAGNOSIS — M79605 Pain in left leg: Secondary | ICD-10-CM

## 2023-11-14 ENCOUNTER — Encounter (INDEPENDENT_AMBULATORY_CARE_PROVIDER_SITE_OTHER): Payer: Self-pay | Admitting: Vascular Surgery

## 2023-11-14 ENCOUNTER — Ambulatory Visit (INDEPENDENT_AMBULATORY_CARE_PROVIDER_SITE_OTHER): Payer: Medicare (Managed Care)

## 2023-11-14 ENCOUNTER — Encounter (INDEPENDENT_AMBULATORY_CARE_PROVIDER_SITE_OTHER)

## 2023-11-14 ENCOUNTER — Ambulatory Visit (INDEPENDENT_AMBULATORY_CARE_PROVIDER_SITE_OTHER): Payer: Medicare (Managed Care) | Admitting: Vascular Surgery

## 2023-11-14 VITALS — BP 139/74 | HR 54 | Ht 70.0 in | Wt 219.4 lb

## 2023-11-14 DIAGNOSIS — E7800 Pure hypercholesterolemia, unspecified: Secondary | ICD-10-CM | POA: Diagnosis not present

## 2023-11-14 DIAGNOSIS — I890 Lymphedema, not elsewhere classified: Secondary | ICD-10-CM | POA: Diagnosis not present

## 2023-11-14 DIAGNOSIS — M79605 Pain in left leg: Secondary | ICD-10-CM | POA: Diagnosis not present

## 2023-11-14 DIAGNOSIS — I10 Essential (primary) hypertension: Secondary | ICD-10-CM

## 2023-11-14 DIAGNOSIS — M7989 Other specified soft tissue disorders: Secondary | ICD-10-CM

## 2023-11-14 NOTE — Progress Notes (Signed)
 Subjective:    Patient ID: Connor Mays, male    DOB: [birth year], 89 y.o.   MRN: [id number] Chief Complaint  Patient presents with   Follow-up    Connor Mays is a 89 year old male who returns to clinic today for 32-month follow-up visit for bilateral lower extremity swelling/lymphedema.  Patient is with his daughter during the visit today.  She endorses that the patient has not been very compliant with conventional therapies such as compression rest and elevation.  This is evident due to the fact that his right lower extremity seems more swollen today and somewhat more red today than what I remember from last visit.  His left lower extremity appears to be swollen but the same.  Patient's daughter was asking about a lymphedema pump.  I explained to the lymphedema pump must be used in conjunction with compression not by itself and it needs to be done 3-4 times a day for an hour at a time in order to have any success.  Patient immediately said he could not do that and would not be able to be compliant with a lymphedema pump.  He does not have any skin breakdown today or any ulcerations or sores.  Daughter insist that today his legs look much better but couple of weeks ago he had prior blistering.  He was at a family reunion in [place] last month in which she was hospitalized for cellulitis of his lower extremities right greater than left.    Review of Systems  Constitutional: Negative.   Cardiovascular:  Positive for leg swelling.  Musculoskeletal:  Positive for gait problem and myalgias.  Skin:  Positive for color change.  All other systems reviewed and are negative.      Objective:   Physical Exam Vitals reviewed.  Constitutional:      Appearance: Normal appearance. He is obese.  HENT:     Head: Normocephalic.  Eyes:     Pupils: Pupils are equal, round, and reactive to light.  Cardiovascular:     Rate and Rhythm: Normal rate and regular rhythm.     Pulses: Normal pulses.     Heart  sounds: Normal heart sounds.  Pulmonary:     Effort: Pulmonary effort is normal.     Breath sounds: Normal breath sounds.  Abdominal:     General: Bowel sounds are normal.     Palpations: Abdomen is soft.  Musculoskeletal:        General: Swelling and tenderness present.     Right lower leg: Edema present.     Left lower leg: Edema present.     Comments: Patient noted to have chronic bilateral lower extremity edema.  Right leg is +3 to +4 edema with left leg being +1 to +2 edema.  Right leg with erythema  Skin:    General: Skin is warm and dry.     Capillary Refill: Capillary refill takes 2 to 3 seconds.  Neurological:     General: No focal deficit present.     Mental Status: He is alert and oriented to person, place, and time. Mental status is at baseline.  Psychiatric:        Mood and Affect: Mood normal.        Behavior: Behavior normal.        Thought Content: Thought content normal.        Judgment: Judgment normal.     BP 139/74   Pulse (!) 54   Ht 5' 10 (1.778 m)  Wt 219 lb 6 oz (99.5 kg)   BMI 31.48 kg/m   Past Medical History:  Diagnosis Date   (HFpEF) heart failure with preserved ejection fraction (HCC)    a.) TTE 08/31/2017: EF > 55%, mild LVH, G2DD, mod LAE, mild RAE, triv panval regurg; b.) TTE 10/07/2020: EF 50%, mod LVH, sev LAE, mild RAE, RVSP 32.5, triv MR, mild TR   Acute deep vein thrombosis (DVT) of right peroneal vein (HCC)    Aortic atherosclerosis (HCC)    Arthritis    Atrial fibrillation and flutter (HCC)    a.) CHA2DS2VASc = 8 (age x2, sex, HFpEF, HTN, DVT x2, prior MI/vascular disease, T2DM) as of 04/26/2023; b.) rate/rhythm maintained on oral carvedilol; chronically anticoagulated with rivaroxaban   Bilateral lower extremity edema    BPH (benign prostatic hyperplasia)    CAD (coronary artery disease)    a.) s/p MI (unk type) 10/1986; b.) s/p 2v CABG 02/1987 (LIMA-LAD, SVG-RCA); c.) s/p MI (unk type) 2007 --> stents x 2 (unk type) to o-mRCA;  d.) LHC 05/02/2017: 90% o-pLCx, 75% oLAD, 100% pLAD, 50% oRI, 50/50% mRCA, 50/95% dRCA, 100% post atrio. Patient RCA stents. 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 in [place]; LIMA-LAD, SVG-RCA   SCC (squamous cell carcinoma) 09/12/2023   right elbow posterior - ED&C   Squamous cell carcinoma of skin 08/11/2022   right dorsum hand, treated with EDC   T2DM (type 2 diabetes mellitus) (HCC)    Weakness of both lower extremities     Social History   Socioeconomic History   Marital status: Widowed    Spouse name: Not on file   Number of children: Not on file   Years of education: Not on file   Highest education level: Not on file  Occupational History   Not on file  Tobacco Use   Smoking status: Former    Types: Cigars   Smokeless tobacco: Former    Quit date: 1970   Tobacco comments:    quit over 40 yrs ago  Vaping Use   Vaping  status: Never Used  Substance and Sexual Activity   Alcohol use: No    Comment: may have a drink 1-2x/yr   Drug use: No   Sexual  activity: Not on file  Other Topics Concern   Not on file  Social History Narrative   Daughter lives with father, assist in care giving   Social Drivers of Health   Financial Resource Strain: Not on file  Food Insecurity: Not on file  Transportation Needs: Not on file  Physical Activity: Not on file  Stress: Not on file  Social Connections: Not on file  Intimate Partner Violence: Not on file    Past Surgical History:  Procedure Laterality Date   CATARACT EXTRACTION W/PHACO Right 08/13/2018   Procedure: CATARACT EXTRACTION PHACO AND INTRAOCULAR LENS PLACEMENT (IOC) RIGHT;  Surgeon: Myrna Adine Anes, MD;  Location: [facility];  Service: Ophthalmology;  Laterality: Right;   CATARACT EXTRACTION W/PHACO Left 09/17/2018   Procedure: CATARACT EXTRACTION PHACO AND INTRAOCULAR LENS PLACEMENT (IOC)  LEFT;  Surgeon: Myrna Adine Anes, MD;  Location: [facility];  Service: Ophthalmology;  Laterality: Left;   COLONOSCOPY W/ POLYPECTOMY     CORONARY ARTERY BYPASS GRAFT N/A 02/1987   Procedure: CORONARY ARTERY BYPASS GRAFT; Location: [facility] in [place]    cyst removed     bottom   HERNIA REPAIR     INGUINAL HERNIA REPAIR Right 04/28/2023   Procedure: HERNIA REPAIR INGUINAL ADULT;  Surgeon: Tye Millet, DO;  Location: [facility];  Service: General;  Laterality: Right;   LEFT HEART CATH AND CORONARY ANGIOGRAPHY Left 10/1986   LEFT HEART CATH AND CORS/GRAFTS ANGIOGRAPHY N/A 05/02/2017   Procedure: LEFT HEART CATH AND CORS/GRAFTS ANGIOGRAPHY;  Surgeon: Florencio Cara BIRCH, MD;  Location: [facility];  Service: Cardiovascular;  Laterality: N/A;   LEFT HEART CATH AND CORS/GRAFTS ANGIOGRAPHY AND STENT INTERVENTION Left 2007   TONSILLECTOMY      Family History  Problem Relation Age of Onset   Healthy Sister    Bladder Cancer Brother     Healthy Sister     Allergies  Allergen Reactions   Morphine Other (See Comments)    Pre-syncope       Latest Ref Rng & Units 04/06/2023    7:42 PM 02/01/2022   10:35 AM 01/18/2021    9:09 AM  CBC  WBC 4.0 - 10.5 K/uL 15.2  14.6  12.4   Hemoglobin 13.0 - 17.0 g/dL 86.0  87.9  86.6   Hematocrit 39.0 - 52.0 % 44.2  37.9  41.8   Platelets 150 - 400 K/uL 180  116  133       CMP     Component Value Date/Time   NA 141 04/06/2023 1942   K 4.1 04/06/2023 1942   CL 103 04/06/2023 1942   CO2 25 04/06/2023 1942   GLUCOSE 165 (H) 04/06/2023 1942   BUN 33 (H) 04/06/2023 1942   CREATININE 1.18 04/06/2023 1942   CALCIUM 9.3 04/06/2023 1942   PROT 6.8 04/06/2023 1942   ALBUMIN 4.2 04/06/2023 1942   AST 16 04/06/2023 1942   ALT 9 04/06/2023 1942   ALKPHOS 52 04/06/2023 1942   BILITOT 0.9 04/06/2023 1942   GFRNONAA 59 (L) 04/06/2023 1942     No results found.     Assessment & Plan:   1. Lymphedema (Primary) Patient presents to clinic for 12-month follow-up post recommendation of conventional therapy consisting of compression, rest, elevation and exercise such as walking.  Patient's daughter is at his side today.  She endorses she has not been compliant with compression or elevation properly.  Therefore her bilateral lower EXTR extremities remain swollen right greater than  left.  Daughter states that he cannot tolerate compression and was requesting a lymphedema pump.  We discussed lymphedema pumps in detail and that in order for him to have any success he would have to wear compression along with using the lymphedema pump but also using the lymphedema pump 3-4 times a day for 45 minutes to an hour at a time.  Patient immediately responded saying he was not going to do that.  He did undergo venous bilateral lower extremity ultrasounds today to assess reflux and DVT.  They were negative.  He does not have any reflux or DVTs to his lower extremities.  I recommend that we start over with  conventional therapy.  I discussed again in detail with his daughter and the patient the need for constant compression, rest, elevation and exercise.  I emphasized that the lack of any type of compliance is only to lead to skin breakdown which will lead to sores and ulcerations as well as infections and possibly loss of limb.  We discussed in detail the use of outside companies other than medical companies that produce compression socks to find something that he may be able to wear and be comfortable with at least control where his swelling is at today.  She was very receptive to this and was willing to help him try to find compression socks that he can wear on a daily basis.  Patient will follow-up in 6 months with no studies after extensive use of compression.   2. Essential hypertension Continue antihypertensive medications as already ordered, these medications have been reviewed and there are no changes at this time.  3. Pure hypercholesterolemia Continue statin as ordered and reviewed, no changes at this time   Current Outpatient Medications on File Prior to Visit  Medication Sig Dispense Refill   Ascorbic Acid (VITAMIN C) 1000 MG tablet Take 1,000 mg by mouth daily.      Calcium Magnesium Zinc 333-133-5 MG TABS 1 tablet with meals for health maintenance Orally once a day     carbidopa-levodopa (SINEMET IR) 25-100 MG tablet Take 2 tablets by mouth 3 (three) times daily.     carvedilol (COREG) 6.25 MG tablet Take 6.25 mg by mouth 2 (two) times daily with a meal.     chlorpheniramine (CHLOR-TRIMETON) 4 MG tablet 1 tablet as needed for allergies per patient rarely takes Orally as needed     Cholecalciferol (VITAMIN D3) 1000 units CAPS Take 1,000 Units by mouth daily.      Cyanocobalamin (B-12 PO) Take 1 tablet by mouth daily.     dabigatran (PRADAXA) 150 MG CAPS capsule Take 150 mg by mouth 2 (two) times daily.     finasteride  (PROSCAR ) 5 MG tablet Take 1 tablet (5 mg total) by mouth daily. 90  tablet 3   levothyroxine (SYNTHROID, LEVOTHROID) 50 MCG tablet Take 50 mcg by mouth daily before breakfast.      losartan (COZAAR) 100 MG tablet Take 100 mg by mouth daily.      MAGNESIUM PO Take 1 tablet by mouth daily.     Multiple Vitamins-Minerals (PRESERVISION AREDS PO) Take 1 tablet by mouth in the morning and at bedtime.     nystatin (MYCOSTATIN/NYSTOP) powder 1 application for fungal rash until cleared Externally Twice a day for 30 days     pramipexole (MIRAPEX) 1 MG tablet Take 1 mg by mouth 3 (three) times daily.     simvastatin (ZOCOR) 40 MG tablet Take 40 mg by mouth at bedtime.  spironolactone (ALDACTONE) 25 MG tablet Take 12.5 mg by mouth daily.     torsemide (DEMADEX) 20 MG tablet Take 20 mg by mouth 3 (three) times a week.     traMADol  (ULTRAM ) 50 MG tablet Take 1 tablet (50 mg total) by mouth every 6 (six) hours as needed. 20 tablet 0   XARELTO 20 MG TABS tablet Take 1 tablet by mouth daily.     No current facility-administered medications on file prior to visit.    There are no Patient Instructions on file for this visit. No follow-ups on file.   Gwendlyn JONELLE Shank, NP

## 2023-12-06 ENCOUNTER — Encounter: Payer: Self-pay | Admitting: Dermatology

## 2023-12-13 ENCOUNTER — Ambulatory Visit: Payer: Medicare (Managed Care) | Admitting: Dermatology

## 2023-12-13 ENCOUNTER — Other Ambulatory Visit: Payer: Self-pay | Admitting: Dermatology

## 2023-12-13 DIAGNOSIS — D489 Neoplasm of uncertain behavior, unspecified: Secondary | ICD-10-CM | POA: Diagnosis not present

## 2023-12-13 DIAGNOSIS — C44622 Squamous cell carcinoma of skin of right upper limb, including shoulder: Secondary | ICD-10-CM

## 2023-12-13 DIAGNOSIS — L219 Seborrheic dermatitis, unspecified: Secondary | ICD-10-CM

## 2023-12-13 DIAGNOSIS — Z7189 Other specified counseling: Secondary | ICD-10-CM

## 2023-12-13 DIAGNOSIS — Z79899 Other long term (current) drug therapy: Secondary | ICD-10-CM

## 2023-12-13 DIAGNOSIS — L910 Hypertrophic scar: Secondary | ICD-10-CM

## 2023-12-13 MED ORDER — KETOCONAZOLE 2 % EX CREA: TOPICAL_CREAM | CUTANEOUS | 11 refills | Status: AC

## 2023-12-13 MED ORDER — HYDROCORTISONE 2.5 % EX CREA: TOPICAL_CREAM | CUTANEOUS | 11 refills | Status: AC

## 2023-12-13 NOTE — Progress Notes (Signed)
 Follow-Up Visit   Subjective  Connor Mays is a 89 y.o. male who presents for the following:  patient here today concerning a painful spot at right elbow. Concerned could be recurrent scc.  Also daughter states patient has rash behind ears.  The following portions of the chart were reviewed this encounter and updated as appropriate: medications, allergies, medical history  Review of Systems:  No other skin or systemic complaints except as noted in HPI or Assessment and Plan.  Objective  Well appearing patient in no apparent distress; mood and affect are within normal limits.  A focused examination was performed of the following areas: Right elbow , face, postauricular area   Relevant exam findings are noted in the Assessment and Plan.  right elbow 2.3 cm pink plaque    Assessment & Plan   NEOPLASM OF UNCERTAIN BEHAVIOR right elbow Epidermal / dermal shaving  Lesion diameter (cm):  2.3 Informed consent: discussed and consent obtained   Timeout: patient name, date of birth, surgical site, and procedure verified   Procedure prep:  Patient was prepped and draped in usual sterile fashion Prep type:  Isopropyl alcohol Anesthesia: the lesion was anesthetized in a standard fashion   Anesthetic:  1% lidocaine  w/ epinephrine  1-100,000 buffered w/ 8.4% NaHCO3 Instrument used: flexible razor blade   Hemostasis achieved with: pressure, aluminum chloride and electrodesiccation   Outcome: patient tolerated procedure well   Post-procedure details: sterile dressing applied and wound care instructions given   Dressing type: bandage and petrolatum    Destruction of lesion Complexity: extensive   Destruction method: electrodesiccation and curettage   Informed consent: discussed and consent obtained   Timeout:  patient name, date of birth, surgical site, and procedure verified Procedure prep:  Patient was prepped and draped in usual sterile fashion Prep type:  Isopropyl  alcohol Anesthesia: the lesion was anesthetized in a standard fashion   Anesthetic:  1% lidocaine  w/ epinephrine  1-100,000 buffered w/ 8.4% NaHCO3 Curettage performed in three different directions: Yes   Electrodesiccation performed over the curetted area: Yes   Final wound size (cm):  2.3 Hemostasis achieved with:  pressure, aluminum chloride and electrodesiccation Outcome: patient tolerated procedure well with no complications   Post-procedure details: sterile dressing applied and wound care instructions given   Dressing type: bandage and petrolatum    Specimen 1 - Surgical pathology Differential Diagnosis: r/o recurrent scc  See previous pathology  Patient: Connor, Mays Collected: 09/12/2023 Client: [facility] Accession: [id number] ED&C today  Check Margins: yes SEBORRHEIC DERMATITIS  SEBORRHEIC DERMATITIS Exam: Pink patches with greasy scale at b/l postauricular, chin, nasolabial fold  Chronic and persistent condition with duration or expected duration over one year. Condition is bothersome/symptomatic for patient. Currently flared. Seborrheic Dermatitis is a chronic persistent rash characterized by pinkness and scaling most commonly of the mid face but also can occur on the scalp (dandruff), ears; mid chest, mid back and groin.  It tends to be exacerbated by stress and cooler weather.  People who have neurologic disease may experience new onset or exacerbation of existing seborrheic dermatitis.  The condition is not curable but treatable and can be controlled.  Treatment Plan: Start HC 2.5 % cream - apply topically to affected areas of rash at face and behind ears nightly on T-Th-Sat Start Ketoconazole 2 % cream apply topically to affected areas of rash at face and behind ears nightly on M-W-F Related Medications ketoconazole (NIZORAL) 2 % cream Apply topically to rash at face  and behind ears nightly at bedtime on Monday Wednesday and Fridays until clear for seborrheic  dermatitis hydrocortisone 2.5 % cream Apply topically to rash at face and behind ears nightly at bedtimes on Tuesday, Thursday and Saturday for seborrheic dermatitis SCC (SQUAMOUS CELL CARCINOMA), ARM, RIGHT   COUNSELING AND COORDINATION OF CARE   MEDICATION MANAGEMENT     Return for keep follow up as scheduled .  IEleanor Blush, CMA, am acting as scribe for Alm Rhyme, MD.   Documentation: I have reviewed the above documentation for accuracy and completeness, and I agree with the above.  Alm Rhyme, MD

## 2023-12-13 NOTE — Patient Instructions (Signed)
 Electrodesiccation and Curettage ("Scrape and Burn") Wound Care Instructions  Leave the original bandage on for 24 hours if possible.  If the bandage becomes soaked or soiled before that time, it is OK to remove it and examine the wound.  A small amount of post-operative bleeding is normal.  If excessive bleeding occurs, remove the bandage, place gauze over the site and apply continuous pressure (no peeking) over the area for 30 minutes. If this does not work, please call our clinic as soon as possible or page your doctor if it is after hours.   Once a day, cleanse the wound with soap and water. It is fine to shower. If a thick crust develops you may use a Q-tip dipped into dilute hydrogen peroxide (mix 1:1 with water) to dissolve it.  Hydrogen peroxide can slow the healing process, so use it only as needed.    After washing, apply petroleum jelly (Vaseline) or an antibiotic ointment if your doctor prescribed one for you, followed by a bandage.    For best healing, the wound should be covered with a layer of ointment at all times. If you are not able to keep the area covered with a bandage to hold the ointment in place, this may mean re-applying the ointment several times a day.  Continue this wound care until the wound has healed and is no longer open. It may take several weeks for the wound to heal and close.  Itching and mild discomfort is normal during the healing process.  If you have any discomfort, you can take Tylenol (acetaminophen) or ibuprofen as directed on the bottle. (Please do not take these if you have an allergy to them or cannot take them for another reason).  Some redness, tenderness and white or yellow material in the wound is normal healing.  If the area becomes very sore and red, or develops a thick yellow-green material (pus), it may be infected; please notify us .    Wound healing continues for up to one year following surgery. It is not unusual to experience pain in the scar  from time to time during the interval.  If the pain becomes severe or the scar thickens, you should notify the office.    A slight amount of redness in a scar is expected for the first six months.  After six months, the redness will fade and the scar will soften and fade.  The color difference becomes less noticeable with time.  If there are any problems, return for a post-op surgery check at your earliest convenience.  To improve the appearance of the scar, you can use silicone scar gel, cream, or sheets (such as Mederma or Serica) every night for up to one year. These are available over the counter (without a prescription).  Please call our office at [phone] for any questions or concerns.  Biopsy Wound Care Instructions  Leave the original bandage on for 24 hours if possible.  If the bandage becomes soaked or soiled before that time, it is OK to remove it and examine the wound.  A small amount of post-operative bleeding is normal.  If excessive bleeding occurs, remove the bandage, place gauze over the site and apply continuous pressure (no peeking) over the area for 30 minutes. If this does not work, please call our clinic as soon as possible or page your doctor if it is after hours.   Once a day, cleanse the wound with soap and water. It is fine to shower. If  a thick crust develops you may use a Q-tip dipped into dilute hydrogen peroxide (mix 1:1 with water) to dissolve it.  Hydrogen peroxide can slow the healing process, so use it only as needed.    After washing, apply petroleum jelly (Vaseline) or an antibiotic ointment if your doctor prescribed one for you, followed by a bandage.    For best healing, the wound should be covered with a layer of ointment at all times. If you are not able to keep the area covered with a bandage to hold the ointment in place, this may mean re-applying the ointment several times a day.  Continue this wound care until the wound has healed and is no longer  open.   Itching and mild discomfort is normal during the healing process. However, if you develop pain or severe itching, please call our office.   If you have any discomfort, you can take Tylenol (acetaminophen) or ibuprofen as directed on the bottle. (Please do not take these if you have an allergy to them or cannot take them for another reason).  Some redness, tenderness and white or yellow material in the wound is normal healing.  If the area becomes very sore and red, or develops a thick yellow-green material (pus), it may be infected; please notify us .    If you have stitches, return to clinic as directed to have the stitches removed. You will continue wound care for 2-3 days after the stitches are removed.   Wound healing continues for up to one year following surgery. It is not unusual to experience pain in the scar from time to time during the interval.  If the pain becomes severe or the scar thickens, you should notify the office.    A slight amount of redness in a scar is expected for the first six months.  After six months, the redness will fade and the scar will soften and fade.  The color difference becomes less noticeable with time.  If there are any problems, return for a post-op surgery check at your earliest convenience.  To improve the appearance of the scar, you can use silicone scar gel, cream, or sheets (such as Mederma or Serica) every night for up to one year. These are available over the counter (without a prescription).  Please call our office at [phone] for any questions or concerns.      Due to recent changes in healthcare laws, you may see results of your pathology and/or laboratory studies on MyChart before the doctors have had a chance to review them. We understand that in some cases there may be results that are confusing or concerning to you. Please understand that not all results are received at the same time and often the doctors may need to interpret  multiple results in order to provide you with the best plan of care or course of treatment. Therefore, we ask that you please give us  2 business days to thoroughly review all your results before contacting the office for clarification. Should we see a critical lab result, you will be contacted sooner.   If You Need Anything After Your Visit  If you have any questions or concerns for your doctor, please call our main line at [phone] and press option 4 to reach your doctor's medical assistant. If no one answers, please leave a voicemail as directed and we will return your call as soon as possible. Messages left after 4 pm will be answered the following business day.  You may also send us  a message via MyChart. We typically respond to MyChart messages within 1-2 business days.  For prescription refills, please ask your pharmacy to contact our office. Our fax number is [phone].  If you have an urgent issue when the clinic is closed that cannot wait until the next business day, you can page your doctor at the number below.    Please note that while we do our best to be available for urgent issues outside of office hours, we are not available 24/7.   If you have an urgent issue and are unable to reach us , you may choose to seek medical care at your doctor's office, retail clinic, urgent care center, or emergency room.  If you have a medical emergency, please immediately call 911 or go to the emergency department.  Pager Numbers  - Dr. Hester: [phone]  - Dr. Jackquline: [phone]  - Dr. Claudene: [phone]   - Dr. Raymund: [phone]  In the event of inclement weather, please call our main line at [phone] for an update on the status of any delays or closures.  Dermatology Medication Tips: Please keep the boxes that topical medications come in in order to help keep track of the instructions about where and how to use these. Pharmacies typically print the medication  instructions only on the boxes and not directly on the medication tubes.   If your medication is too expensive, please contact our office at [phone] option 4 or send us  a message through MyChart.   We are unable to tell what your co-pay for medications will be in advance as this is different depending on your insurance coverage. However, we may be able to find a substitute medication at lower cost or fill out paperwork to get insurance to cover a needed medication.   If a prior authorization is required to get your medication covered by your insurance company, please allow us  1-2 business days to complete this process.  Drug prices often vary depending on where the prescription is filled and some pharmacies may offer cheaper prices.  The website www.goodrx.com contains coupons for medications through different pharmacies. The prices here do not account for what the cost may be with help from insurance (it may be cheaper with your insurance), but the website can give you the price if you did not use any insurance.  - You can print the associated coupon and take it with your prescription to the pharmacy.  - You may also stop by our office during regular business hours and pick up a GoodRx coupon card.  - If you need your prescription sent electronically to a different pharmacy, notify our office through [facility] or by phone at [phone] option 4.     Si Usted Necesita Algo Despus de Su Visita  Tambin puede enviarnos un mensaje a travs de Clinical cytogeneticist. Por lo general respondemos a los mensajes de MyChart en el transcurso de 1 a 2 das hbiles.  Para renovar recetas, por favor pida a su farmacia que se ponga en contacto con nuestra oficina. Randi lakes de fax es [place] [phone].  Si tiene un asunto urgente cuando la clnica est cerrada y que no puede esperar hasta el siguiente da hbil, puede llamar/localizar a su doctor(a) al nmero que aparece a continuacin.   Por  favor, tenga en cuenta que aunque hacemos todo lo posible para estar disponibles para asuntos urgentes fuera del horario de [place], no estamos disponibles las 24 horas del da, los  7 das de [employer].   Si tiene un problema urgente y no puede comunicarse con nosotros, puede optar por buscar atencin mdica  en el consultorio de su doctor(a), en una clnica privada, en un centro de atencin urgente o en una sala de emergencias.  Si tiene Engineer, drilling, por favor llame inmediatamente al 911 o vaya a la sala de emergencias.  Nmeros de bper  - Dr. Hester: [phone]  - Dra. Jackquline: [phone]  - Dr. Claudene: [phone]  - Dra. Kitts: [phone]  En caso de inclemencias del [place], por favor llame a nuestra lnea principal al [phone] para una actualizacin sobre el estado de cualquier retraso o cierre.  Consejos para la medicacin en dermatologa: Por favor, guarde las cajas en las que vienen los medicamentos de uso tpico para ayudarle a seguir las instrucciones sobre dnde y cmo usarlos. Las farmacias generalmente imprimen las instrucciones del medicamento slo en las cajas y no directamente en los tubos del [place].   Si su medicamento es muy caro, por favor, pngase en contacto con landry rieger llamando al [phone] y presione la opcin 4 o envenos un mensaje a travs de Clinical cytogeneticist.   No podemos decirle cul ser su copago por los medicamentos por adelantado ya que esto es diferente dependiendo de la cobertura de su seguro. Sin embargo, es posible que podamos encontrar un medicamento sustituto a Audiological scientist un formulario para que el seguro cubra el medicamento que se considera necesario.   Si se requiere una autorizacin previa para que su compaa de seguros malta su medicamento, por favor permtanos de 1 a 2 das hbiles para completar este proceso.  Los precios de los medicamentos varan con frecuencia dependiendo del Environmental consultant de dnde se surte la  receta y alguna farmacias pueden ofrecer precios ms baratos.  El sitio web www.goodrx.com tiene cupones para medicamentos de Health and safety inspector. Los precios aqu no tienen en cuenta lo que podra costar con la ayuda del seguro (puede ser ms barato con su seguro), pero el sitio web puede darle el precio si no utiliz Tourist information centre manager.  - Puede imprimir el cupn correspondiente y llevarlo con su receta a la farmacia.  - Tambin puede pasar por nuestra oficina durante el horario de atencin regular y Education officer, museum una tarjeta de cupones de GoodRx.  - Si necesita que su receta se enve electrnicamente a una farmacia diferente, informe a nuestra oficina a travs de MyChart de [place] o por telfono llamando al [phone] y presione la opcin 4.

## 2023-12-14 ENCOUNTER — Encounter: Payer: Self-pay | Admitting: Dermatology

## 2023-12-14 LAB — DERMATOLOGY PATHOLOGY

## 2023-12-18 ENCOUNTER — Ambulatory Visit: Payer: Self-pay | Admitting: Dermatology

## 2023-12-19 ENCOUNTER — Encounter: Payer: Self-pay | Admitting: Dermatology

## 2023-12-19 NOTE — Telephone Encounter (Signed)
LM on VM please return my call, NF

## 2023-12-19 NOTE — Telephone Encounter (Signed)
-----   Message from Alm Rhyme sent at 12/18/2023  6:09 PM EDT ----- FINAL DIAGNOSIS        1. Skin, right elbow :       WELL DIFFERENTIATED SQUAMOUS CELL CARCINOMA, LIMITED MARGINS FREE AND       HYPERTROPHIC SCAR   Cancer = SCC Will differentiated Margins free Already treated with EDC Recheck next visit ----- Message ----- From: Interface, Lab In Three [place] [facility] Sent: 12/18/2023   1:53 PM EDT To: Alm JAYSON Rhyme, MD

## 2023-12-20 ENCOUNTER — Encounter: Payer: Self-pay | Admitting: Dermatology

## 2023-12-20 NOTE — Telephone Encounter (Addendum)
 Tried calling patient regarding results. No answer. Lm to return call. ----- Message from Alm Rhyme sent at 12/18/2023  6:09 PM EDT ----- FINAL DIAGNOSIS        1. Skin, right elbow :       WELL DIFFERENTIATED SQUAMOUS CELL CARCINOMA, LIMITED MARGINS FREE AND       HYPERTROPHIC SCAR   Cancer = SCC Will differentiated Margins free Already treated with EDC Recheck next visit ----- Message ----- From: Interface, Lab In Three [place] Seven Sent: 12/18/2023   1:53 PM EDT To: Alm JAYSON Rhyme, MD

## 2023-12-21 ENCOUNTER — Encounter: Payer: Self-pay | Admitting: Dermatology

## 2023-12-21 NOTE — Telephone Encounter (Addendum)
 Called and discussed bx results with patient's daughter Devere. She denied further questions. Will recheck area at patient's next followup.    ----- Message from Alm Rhyme sent at 12/18/2023  6:09 PM EDT ----- FINAL DIAGNOSIS        1. Skin, right elbow :       WELL DIFFERENTIATED SQUAMOUS CELL CARCINOMA, LIMITED MARGINS FREE AND       HYPERTROPHIC SCAR   Cancer = SCC Will differentiated Margins free Already treated with EDC Recheck next visit ----- Message ----- From: Interface, Lab In Three Zero Seven Sent: 12/18/2023   1:53 PM EDT To: Alm JAYSON Rhyme, MD

## 2024-04-04 ENCOUNTER — Encounter: Payer: Self-pay | Admitting: Dermatology

## 2024-04-29 ENCOUNTER — Ambulatory Visit: Admitting: Dermatology

## 2024-07-24 ENCOUNTER — Ambulatory Visit: Admitting: Dermatology
